# Patient Record
Sex: Female | Born: 1970 | Race: Black or African American | Hispanic: No | Marital: Single | Smoking: Former smoker
Health system: Southern US, Community
[De-identification: ages and names within clinical notes are randomized; demographics above are authoritative.]

## PROBLEM LIST (undated history)

## (undated) DIAGNOSIS — K219 Gastro-esophageal reflux disease without esophagitis: Secondary | ICD-10-CM

## (undated) DIAGNOSIS — J42 Unspecified chronic bronchitis: Secondary | ICD-10-CM

## (undated) DIAGNOSIS — J4530 Mild persistent asthma, uncomplicated: Secondary | ICD-10-CM

## (undated) DIAGNOSIS — L509 Urticaria, unspecified: Secondary | ICD-10-CM

## (undated) DIAGNOSIS — L309 Dermatitis, unspecified: Secondary | ICD-10-CM

## (undated) HISTORY — DX: Dermatitis, unspecified: L30.9

## (undated) HISTORY — DX: Mild persistent asthma, uncomplicated: J45.30

## (undated) HISTORY — DX: Urticaria, unspecified: L50.9

## (undated) HISTORY — PX: TYMPANOSTOMY TUBE PLACEMENT: SHX32

## (undated) HISTORY — DX: Gastro-esophageal reflux disease without esophagitis: K21.9

## (undated) HISTORY — DX: Unspecified chronic bronchitis: J42

---

## 2004-08-05 ENCOUNTER — Encounter: Admission: RE | Admit: 2004-08-05 | Discharge: 2004-08-05 | Payer: Self-pay | Admitting: Gastroenterology

## 2004-09-04 ENCOUNTER — Encounter: Admission: RE | Admit: 2004-09-04 | Discharge: 2004-09-04 | Payer: Self-pay | Admitting: Gastroenterology

## 2010-01-19 ENCOUNTER — Emergency Department (HOSPITAL_COMMUNITY): Admission: EM | Admit: 2010-01-19 | Discharge: 2010-01-19 | Payer: Self-pay | Admitting: Family Medicine

## 2015-03-20 ENCOUNTER — Ambulatory Visit (INDEPENDENT_AMBULATORY_CARE_PROVIDER_SITE_OTHER): Payer: PRIVATE HEALTH INSURANCE | Admitting: Internal Medicine

## 2015-03-20 ENCOUNTER — Encounter: Payer: Self-pay | Admitting: Internal Medicine

## 2015-03-20 VITALS — BP 120/88 | HR 81 | Temp 98.3°F | Resp 16 | Ht 62.0 in | Wt 156.0 lb

## 2015-03-20 DIAGNOSIS — L309 Dermatitis, unspecified: Secondary | ICD-10-CM | POA: Diagnosis not present

## 2015-03-20 DIAGNOSIS — E559 Vitamin D deficiency, unspecified: Secondary | ICD-10-CM

## 2015-03-20 DIAGNOSIS — M25511 Pain in right shoulder: Secondary | ICD-10-CM | POA: Insufficient documentation

## 2015-03-20 DIAGNOSIS — J302 Other seasonal allergic rhinitis: Secondary | ICD-10-CM | POA: Diagnosis not present

## 2015-03-20 DIAGNOSIS — M25512 Pain in left shoulder: Secondary | ICD-10-CM

## 2015-03-20 DIAGNOSIS — J3089 Other allergic rhinitis: Secondary | ICD-10-CM | POA: Insufficient documentation

## 2015-03-20 MED ORDER — TRIAMCINOLONE ACETONIDE 0.1 % EX CREA: 1.0000 "application " | TOPICAL_CREAM | Freq: Two times a day (BID) | CUTANEOUS | Status: DC

## 2015-03-20 NOTE — Assessment & Plan Note (Signed)
?   Contact dermatitis We will try a topical steroid cream twice daily for up to 2 weeks. Advised her not to take for one weeks Can use intermittently If no improvement she will need a dermatologist

## 2015-03-20 NOTE — Progress Notes (Signed)
Pre visit review using our clinic review tool, if applicable. No additional management support is needed unless otherwise documented below in the visit note. 

## 2015-03-20 NOTE — Progress Notes (Signed)
Subjective:    Patient ID: Kathleen Ortiz, female    DOB: [birth year], 44 y.o.   MRN: [id number]  HPI She is here to establish with a new pcp.   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  She has had bilateral shoulder/arm pain for a couple of months- they ache.  It has improved, but they are still slightly painful. Picking her arms up a certain way or putting her bra on/off would increase the pain.  Laying on either shoulder would be painful.  She denies any other new activities that could have caused it.  She did not take any otc meds, they just improved on their own.    Itch on right hand.  She used to work with chemicals, but has not done that for months. She developed an itchy rash and thinks it may be from the chemicals. She has a rash on her right hand that is.  If she itches it, it will itch all day.  She has not tried putting anything on it.  Smokes two cigarettes daily.  One with coffee and one before bed.  She has not tried quitting.    She is not exercising regularly.  Medications and allergies reviewed with patient and updated if appropriate.  Patient Active Problem List   Diagnosis Date Noted  . Allergic rhinitis 03/20/2015  . Vitamin D deficiency 03/20/2015    No current outpatient prescriptions on file prior to visit.   No current facility-administered medications on file prior to visit.    Past Medical History  Diagnosis Date  . GERD (gastroesophageal reflux disease)   . Chronic bronchitis (HCC)     History reviewed. No pertinent past surgical history.  Social History   Social History  . Marital Status: Single    Spouse Name: N/A  . Number of Children: N/A  . Years of Education: N/A   Social History Main Topics  . Smoking status: Current Every Day Smoker  . Smokeless tobacco: Never Used  . Alcohol Use: Yes  . Drug Use: No  . Sexual Activity: Not Asked   Other  Topics Concern  . None   Social History Narrative   No regular exercise          Review of Systems  Constitutional: Negative for fever and chills.  Respiratory: Negative for cough, shortness of breath and wheezing.   Cardiovascular: Negative for chest pain, palpitations and leg swelling.  Neurological: Positive for headaches (with periods). Negative for dizziness, weakness, light-headedness and numbness.  Psychiatric/Behavioral:       Worries about her son - has schizophrenia       Objective:   Filed Vitals:   03/20/15 1520  BP: 120/88  Pulse: 81  Temp: 98.3 F (36.8 C)  Resp: 16   Filed Weights   03/20/15 1520  Weight: 156 lb (70.761 kg)   Body mass index is 28.53 kg/(m^2).   Physical Exam  Constitutional: She appears well-developed and well-nourished. No distress.  HENT:  Head: Normocephalic and atraumatic.  Neck: Neck supple. No tracheal deviation present. No thyromegaly present.  Cardiovascular: Normal rate, regular rhythm and normal heart sounds.   No murmur heard. Pulmonary/Chest: Effort normal and breath sounds normal. No respiratory distress. She has no wheezes. She has no rales.  Abdominal: Soft. She exhibits no distension. There is no tenderness.  Musculoskeletal: She exhibits no edema.  B/l shoulder with  FROM, slight tenderness on anterior aspect of shoulder, no swelling/deformity  Lymphadenopathy:    She has no cervical adenopathy.  Neurological:  No numbness/tingling or weakness in upper extremities  Skin: Skin is warm and dry. Rash (slight hyperpigmentation on right posterior hand - circular, sliightly raised) noted. She is not diaphoretic.  Psychiatric: She has a normal mood and affect. Her behavior is normal.       Assessment & Plan:   See Problem List for A/P  Follow up as needed

## 2015-03-20 NOTE — Patient Instructions (Signed)
Call if your shoulder pain does not continue to improve or worsens.  I will have you see a sports medicine doctor.   Use the steroid cream on the rash on your right hand as prescribed.  If the rash does not go away consider seeing your dermatologist.   Follow up as needed

## 2015-03-20 NOTE — Assessment & Plan Note (Signed)
Controlled Continue medication as needed

## 2015-03-20 NOTE — Assessment & Plan Note (Signed)
Improving without intervention If it does not continue to improve or if her shoulder pain worsens she will let me know and I will refer to Dr. Katrinka BlazingSmith for further evaluation

## 2015-07-29 ENCOUNTER — Encounter: Payer: Self-pay | Admitting: Internal Medicine

## 2015-07-29 ENCOUNTER — Ambulatory Visit (INDEPENDENT_AMBULATORY_CARE_PROVIDER_SITE_OTHER): Payer: PRIVATE HEALTH INSURANCE | Admitting: Internal Medicine

## 2015-07-29 ENCOUNTER — Ambulatory Visit (INDEPENDENT_AMBULATORY_CARE_PROVIDER_SITE_OTHER)
Admission: RE | Admit: 2015-07-29 | Discharge: 2015-07-29 | Disposition: A | Discharge status: Home / Self Care | Payer: PRIVATE HEALTH INSURANCE | Source: Ambulatory Visit | Attending: Internal Medicine | Admitting: Internal Medicine

## 2015-07-29 VITALS — BP 118/86 | HR 90 | Temp 99.2°F | Resp 16 | Ht 63.0 in | Wt 154.0 lb

## 2015-07-29 DIAGNOSIS — M7581 Other shoulder lesions, right shoulder: Secondary | ICD-10-CM

## 2015-07-29 MED ORDER — NAPROXEN 500 MG PO TABS: 500.0000 mg | ORAL_TABLET | Freq: Two times a day (BID) | ORAL | Status: DC

## 2015-07-29 NOTE — Patient Instructions (Addendum)
Have your xray today and we will call you with the results.  Take the naprosyn twice daily with food - do not take the dinner dose if you take the aleve pm at night.  Monitor for stomach symptoms (heartburn, stomach pain or upset) and if you have this stop the medication.       Rotator Cuff Tendinitis Rotator cuff tendinitis is inflammation of the tough, cord-like bands that connect muscle to bone (tendons) in your rotator cuff. Your rotator cuff is the collection of all the muscles and tendons that connect your arm to your shoulder. Your rotator cuff holds the head of your upper arm bone (humerus) in the cup (fossa) of your shoulder blade (scapula). CAUSES Rotator cuff tendinitis is usually caused by overusing the joint involved.  SIGNS AND SYMPTOMS  Deep ache in the shoulder also felt on the outside upper arm over the shoulder muscle.  Point tenderness over the area that is injured.  Pain comes on gradually and becomes worse with lifting the arm to the side (abduction) or turning it inward (internal rotation).  May lead to a chronic tear: When a rotator cuff tendon becomes inflamed, it runs the risk of losing its blood supply, causing some tendon fibers to die. This increases the risk that the tendon can fray and partially or completely tear. DIAGNOSIS Rotator cuff tendinitis is diagnosed by taking a medical history, performing a physical exam, and reviewing results of imaging exams. The medical history is useful to help determine the type of rotator cuff injury. The physical exam will include looking at the injured shoulder, feeling the injured area, and watching you do range-of-motion exercises. X-ray exams are typically done to rule out other causes of shoulder pain, such as fractures. MRI is the imaging exam usually used for significant shoulder injuries. Sometimes a dye study called CT arthrogram is done, but it is not as widely used as MRI. In some institutions, special ultrasound tests  may also be used to aid in the diagnosis. TREATMENT  Less Severe Cases  Use of a sling to rest the shoulder for a short period of time. Prolonged use of the sling can cause stiffness, weakness, and loss of motion of the shoulder joint.  Anti-inflammatory medicines, such as ibuprofen or naproxen sodium, may be prescribed. More Severe Cases  Physical therapy.  Use of steroid injections into the shoulder joint.  Surgery. HOME CARE INSTRUCTIONS   Use a sling or splint until the pain decreases. Prolonged use of the sling can cause stiffness, weakness, and loss of motion of the shoulder joint.  Apply ice to the injured area:  Put ice in a plastic bag.  Place a towel between your skin and the bag.  Leave the ice on for 20 minutes, 2-3 times a day.  Try to avoid use other than gentle range of motion while your shoulder is painful. Use the shoulder and exercise only as directed by your health care provider. Stop exercises or range of motion if pain or discomfort increases, unless directed otherwise by your health care provider.  Only take over-the-counter or prescription medicines for pain, discomfort, or fever as directed by your health care provider.  If you were given a shoulder sling and straps (immobilizer), do not remove it except as directed, or until you see a health care provider for a follow-up exam. If you need to remove it, move your arm as little as possible or as directed.  You may want to sleep on several pillows  at night to lessen swelling and pain. SEEK IMMEDIATE MEDICAL CARE IF:   Your shoulder pain increases or new pain develops in your arm, hand, or fingers and is not relieved with medicines.  You have new, unexplained symptoms, especially increased numbness in the hands or loss of strength.  You develop any worsening of the problems that brought you in for care.  Your arm, hand, or fingers are numb or tingling.  Your arm, hand, or fingers are swollen, painful,  or turn white or blue. MAKE SURE YOU:  Understand these instructions.  Will watch your condition.  Will get help right away if you are not doing well or get worse.   This information is not intended to replace advice given to you by your health care provider. Make sure you discuss any questions you have with your health care provider.   Document Released: 06/13/2003 Document Revised: 04/13/2014 Document Reviewed: 11/02/2012 Elsevier Interactive Patient Education [employer]

## 2015-07-29 NOTE — Progress Notes (Signed)
Pre visit review using our clinic review tool, if applicable. No additional management support is needed unless otherwise documented below in the visit note. 

## 2015-07-29 NOTE — Progress Notes (Signed)
Subjective:    Patient ID: Kathleen Ortiz, female    DOB: 1970-11-03, 45 y.o.   MRN: [id number]  HPI She is here for an acute visit for shoulder pain.     Her symptoms started in the fall of last year.  She was having b/l shoulder pain and related it to her job.  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  Certain movements with her arms and putting her bra on and off increases her pain.  Laying on her shoulders is painful at night.  When I last saw her in December the pain in her shoulders had improved without treatment.   It never went away, but has gotten tolerable.  Some days she does not have any pain, but a few days ago she had severe pain in the right shoulder.  Certain movements increase her pain.  Over the counter meds have not helped, including aleve, which she typically takes one aleve pm at night.  She tried tramadol and it did not help.  The pain radiates down her right arm.  She denies numbness, tingling in the arm.    She denies weakness in the hands.  The pain in her left shoulder is minimal and intermittent.     Medications and allergies reviewed with patient and updated if appropriate.  Patient Active Problem List   Diagnosis Date Noted  . Allergic rhinitis 03/20/2015  . Vitamin D deficiency 03/20/2015  . Pain of both shoulder joints 03/20/2015  . Dermatitis 03/20/2015    Current Outpatient Prescriptions on File Prior to Visit  Medication Sig Dispense Refill  . Aspirin-Salicylamide-Caffeine (BC HEADACHE PO) Take by mouth daily as needed.    . clindamycin (CLEOCIN T) 1 % lotion Apply topically 2 (two) times daily.    Marland Kitchen Fexofenadine HCl (ALLEGRA PO) Take by mouth daily.    . Fluticasone Propionate (FLONASE NA) Place into the nose daily as needed.    . Pseudoephedrine-APAP-DM (DAYQUIL PO) Take by mouth as needed.    . triamcinolone cream (KENALOG) 0.1 % Apply 1 application topically 2 (two) times daily. Do not use for more than two  weeks, can use intermittently as needed 30 g 0  . Vitamin D, Cholecalciferol, 1000 UNITS CAPS Take 2,000 Units by mouth 3 (three) times a week.     No current facility-administered medications on file prior to visit.    Past Medical History  Diagnosis Date  . GERD (gastroesophageal reflux disease)   . Chronic bronchitis (HCC)     No past surgical history on file.  Social History   Social History  . Marital Status: Single    Spouse Name: N/A  . Number of Children: N/A  . Years of Education: N/A   Social History Main Topics  . Smoking status: Current Every Day Smoker  . Smokeless tobacco: Never Used  . Alcohol Use: Yes  . Drug Use: No  . Sexual Activity: Not Asked   Other Topics Concern  . None   Social History Narrative   No regular exercise          Family History  Problem Relation Age of Onset  . Arthritis Mother   . Cancer Mother     Lung & colon  . Hypertension Mother   . Diabetes Maternal Aunt   . Arthritis Maternal Grandmother   . Hypertension Maternal Grandmother   . Heart disease Maternal Grandmother   . Diabetes Maternal Grandmother   .  Cancer Paternal Grandfather     Review of Systems See HPI    Objective:   Filed Vitals:   07/29/15 0944  BP: 118/86  Pulse: 90  Temp: 99.2 F (37.3 C)  Resp: 16   Filed Weights   07/29/15 0944  Weight: 154 lb (69.854 kg)   Body mass index is 27.29 kg/(m^2).   Physical Exam A Right Shoulder exam was performed.   SWELLING: none  EFFUSION: no  WARMTH: no warmth  TENDERNESS: tenderness anterior aspect of shoulder, mild tenderness on lateral and posterior aspect of shoulder ROM: full ROM with pain, no crepitus NEUROLOGICAL EXAM: normal sensation and strength  PULSES: normal       Assessment & Plan:   Right shoulder pain Possible rotator tendinitis Will check an xray, but I expect it to be normal Start naprosyn 500 mg twice daily with food  -  She will not take the dinner dose if she want to  take aleve pm --  Stop if she experiences stomach symptoms Will see Tammy SoursGreg for possible steroid injection

## 2015-08-02 ENCOUNTER — Ambulatory Visit (INDEPENDENT_AMBULATORY_CARE_PROVIDER_SITE_OTHER): Payer: PRIVATE HEALTH INSURANCE | Admitting: Family

## 2015-08-02 ENCOUNTER — Encounter: Payer: Self-pay | Admitting: Family

## 2015-08-02 VITALS — BP 118/86 | HR 76 | Temp 98.1°F | Resp 16 | Ht 63.0 in | Wt 154.0 lb

## 2015-08-02 DIAGNOSIS — M25511 Pain in right shoulder: Secondary | ICD-10-CM | POA: Insufficient documentation

## 2015-08-02 MED ORDER — DICLOFENAC SODIUM 2 % TD SOLN: 1.0000 "application " | Freq: Two times a day (BID) | TRANSDERMAL | Status: DC | PRN

## 2015-08-02 MED ORDER — NAPROXEN-ESOMEPRAZOLE 500-20 MG PO TBEC: 1.0000 | DELAYED_RELEASE_TABLET | Freq: Two times a day (BID) | ORAL | Status: DC | PRN

## 2015-08-02 NOTE — Patient Instructions (Addendum)
Thank you for choosing [facility].  Summary/Instructions:  Ice 2-3 times per day and after [employer] daily. Pennsaid - 2x per day as needed about 1/2 pack per dose Vimovo - 2x per day as needed.   Your prescription(s) have been submitted to your pharmacy or been printed and provided for you. Please take as directed and contact our office if you believe you are having problem(s) with the medication(s) or have any questions. If your symptoms worsen or fail to improve, please contact our office for further instruction, or in case of emergency go directly to the emergency room at the closest medical facility.    Impingement Syndrome, Rotator Cuff, Bursitis With Rehab Impingement syndrome is a condition that involves inflammation of the tendons of the rotator cuff and the subacromial bursa, that causes pain in the shoulder. The rotator cuff consists of four tendons and muscles that control much of the shoulder and upper arm function. The subacromial bursa is a fluid filled sac that helps reduce friction between the rotator cuff and one of the bones of the shoulder (acromion). Impingement syndrome is usually an overuse injury that causes swelling of the bursa (bursitis), swelling of the tendon (tendonitis), and/or a tear of the tendon (strain). Strains are classified into three categories. Grade 1 strains cause pain, but the tendon is not lengthened. Grade 2 strains include a lengthened ligament, due to the ligament being stretched or partially ruptured. With grade 2 strains there is still function, although the function may be decreased. Grade 3 strains include a complete tear of the tendon or muscle, and function is usually impaired. SYMPTOMS   Pain around the shoulder, often at the outer portion of the upper arm.  Pain that gets worse with shoulder function, especially when reaching overhead or lifting.  Sometimes, aching when not using the arm.  Pain that wakes you up at  night.  Sometimes, tenderness, swelling, warmth, or redness over the affected area.  Loss of strength.  Limited motion of the shoulder, especially reaching behind the back (to the back pocket or to unhook bra) or across your body.  Crackling sound (crepitation) when moving the arm.  Biceps tendon pain and inflammation (in the front of the shoulder). Worse when bending the elbow or lifting. CAUSES  Impingement syndrome is often an overuse injury, in which chronic (repetitive) motions cause the tendons or bursa to become inflamed. A strain occurs when a force is paced on the tendon or muscle that is greater than it can withstand. Common mechanisms of injury include: Stress from sudden increase in duration, frequency, or intensity of training.  Direct hit (trauma) to the shoulder.  Aging, erosion of the tendon with normal use.  Bony bump on shoulder (acromial spur). RISK INCREASES WITH:  Contact sports (football, wrestling, boxing).  Throwing sports (baseball, tennis, volleyball).  Weightlifting and bodybuilding.  Heavy labor.  Previous injury to the rotator cuff, including impingement.  Poor shoulder strength and flexibility.  Failure to warm up properly before activity.  Inadequate protective equipment.  Old age.  Bony bump on shoulder (acromial spur). PREVENTION   Warm up and stretch properly before activity.  Allow for adequate recovery between workouts.  Maintain physical fitness:  Strength, flexibility, and endurance.  Cardiovascular fitness.  Learn and use proper exercise technique. PROGNOSIS  If treated properly, impingement syndrome usually goes away within 6 weeks. Sometimes surgery is required.  RELATED COMPLICATIONS   Longer healing time if not properly treated, or if not given enough time  to heal.  Recurring symptoms, that result in a chronic condition.  Shoulder stiffness, frozen shoulder, or loss of motion.  Rotator cuff tendon  tear.  Recurring symptoms, especially if activity is resumed too soon, with overuse, with a direct blow, or when using poor technique. TREATMENT  Treatment first involves the use of ice and medicine, to reduce pain and inflammation. The use of strengthening and stretching exercises may help reduce pain with activity. These exercises may be performed at home or with a therapist. If non-surgical treatment is unsuccessful after more than 6 months, surgery may be advised. After surgery and rehabilitation, activity is usually possible in 3 months.  MEDICATION  If pain medicine is needed, nonsteroidal anti-inflammatory medicines (aspirin and ibuprofen), or other minor pain relievers (acetaminophen), are often advised.  Do not take pain medicine for 7 days before surgery.  Prescription pain relievers may be given, if your caregiver thinks they are needed. Use only as directed and only as much as you need.  Corticosteroid injections may be given by your caregiver. These injections should be reserved for the most serious cases, because they may only be given a certain number of times. HEAT AND COLD  Cold treatment (icing) should be applied for 10 to 15 minutes every 2 to 3 hours for inflammation and pain, and immediately after activity that aggravates your symptoms. Use ice packs or an ice massage.  Heat treatment may be used before performing stretching and strengthening activities prescribed by your caregiver, physical therapist, or athletic trainer. Use a heat pack or a warm water soak. SEEK MEDICAL CARE IF:   Symptoms get worse or do not improve in 4 to 6 weeks, despite treatment.  New, unexplained symptoms develop. (Drugs used in treatment may produce side effects.) EXERCISES  RANGE OF MOTION (ROM) AND STRETCHING EXERCISES - Impingement Syndrome (Rotator Cuff  Tendinitis, Bursitis) These exercises may help you when beginning to rehabilitate your injury. Your symptoms may go away with or  without further involvement from your physician, physical therapist or athletic trainer. While completing these exercises, remember:   Restoring tissue flexibility helps normal motion to return to the joints. This allows healthier, less painful movement and activity.  An effective stretch should be held for at least 30 seconds.  A stretch should never be painful. You should only feel a gentle lengthening or release in the stretched tissue. STRETCH - Flexion, Standing  Stand with good posture. With an underhand grip on your right / left hand, and an overhand grip on the opposite hand, grasp a broomstick or cane so that your hands are a little more than shoulder width apart.  Keeping your right / left elbow straight and shoulder muscles relaxed, push the stick with your opposite hand, to raise your right / left arm in front of your body and then overhead. Raise your arm until you feel a stretch in your right / left shoulder, but before you have increased shoulder pain.  Try to avoid shrugging your right / left shoulder as your arm rises, by keeping your shoulder blade tucked down and toward your mid-back spine. Hold for __________ seconds.  Slowly return to the starting position. Repeat __________ times. Complete this exercise __________ times per day. STRETCH - Abduction, Supine  Lie on your back. With an underhand grip on your right / left hand and an overhand grip on the opposite hand, grasp a broomstick or cane so that your hands are a little more than shoulder width apart.  Keeping  your right / left elbow straight and your shoulder muscles relaxed, push the stick with your opposite hand, to raise your right / left arm out to the side of your body and then overhead. Raise your arm until you feel a stretch in your right / left shoulder, but before you have increased shoulder pain.  Try to avoid shrugging your right / left shoulder as your arm rises, by keeping your shoulder blade tucked down  and toward your mid-back spine. Hold for __________ seconds.  Slowly return to the starting position. Repeat __________ times. Complete this exercise __________ times per day. ROM - Flexion, Active-Assisted  Lie on your back. You may bend your knees for comfort.  Grasp a broomstick or cane so your hands are about shoulder width apart. Your right / left hand should grip the end of the stick, so that your hand is positioned "thumbs-up," as if you were about to shake hands.  Using your healthy arm to lead, raise your right / left arm overhead, until you feel a gentle stretch in your shoulder. Hold for __________ seconds.  Use the stick to assist in returning your right / left arm to its starting position. Repeat __________ times. Complete this exercise __________ times per day.  ROM - Internal Rotation, Supine   Lie on your back on a firm surface. Place your right / left elbow about 60 degrees away from your side. Elevate your elbow with a folded towel, so that the elbow and shoulder are the same height.  Using a broomstick or cane and your strong arm, pull your right / left hand toward your body until you feel a gentle stretch, but no increase in your shoulder pain. Keep your shoulder and elbow in place throughout the exercise.  Hold for __________ seconds. Slowly return to the starting position. Repeat __________ times. Complete this exercise __________ times per day. STRETCH - Internal Rotation  Place your right / left hand behind your back, palm up.  Throw a towel or belt over your opposite shoulder. Grasp the towel with your right / left hand.  While keeping an upright posture, gently pull up on the towel, until you feel a stretch in the front of your right / left shoulder.  Avoid shrugging your right / left shoulder as your arm rises, by keeping your shoulder blade tucked down and toward your mid-back spine.  Hold for __________ seconds. Release the stretch, by lowering your  healthy hand. Repeat __________ times. Complete this exercise __________ times per day. ROM - Internal Rotation   Using an underhand grip, grasp a stick behind your back with both hands.  While standing upright with good posture, slide the stick up your back until you feel a mild stretch in the front of your shoulder.  Hold for __________ seconds. Slowly return to your starting position. Repeat __________ times. Complete this exercise __________ times per day.  STRETCH - Posterior Shoulder Capsule   Stand or sit with good posture. Grasp your right / left elbow and draw it across your chest, keeping it at the same height as your shoulder.  Pull your elbow, so your upper arm comes in closer to your chest. Pull until you feel a gentle stretch in the back of your shoulder.  Hold for __________ seconds. Repeat __________ times. Complete this exercise __________ times per day. STRENGTHENING EXERCISES - Impingement Syndrome (Rotator Cuff Tendinitis, Bursitis) These exercises may help you when beginning to rehabilitate your injury. They may resolve your symptoms  with or without further involvement from your physician, physical therapist or athletic trainer. While completing these exercises, remember:  Muscles can gain both the endurance and the strength needed for everyday activities through controlled exercises.  Complete these exercises as instructed by your physician, physical therapist or athletic trainer. Increase the resistance and repetitions only as guided.  You may experience muscle soreness or fatigue, but the pain or discomfort you are trying to eliminate should never worsen during these exercises. If this pain does get worse, stop and make sure you are following the directions exactly. If the pain is still present after adjustments, discontinue the exercise until you can discuss the trouble with your clinician.  During your recovery, avoid activity or exercises which involve actions  that place your injured hand or elbow above your head or behind your back or head. These positions stress the tissues which you are trying to heal. STRENGTH - Scapular Depression and Adduction   With good posture, sit on a firm chair. Support your arms in front of you, with pillows, arm rests, or on a table top. Have your elbows in line with the sides of your body.  Gently draw your shoulder blades down and toward your mid-back spine. Gradually increase the tension, without tensing the muscles along the top of your shoulders and the back of your neck.  Hold for __________ seconds. Slowly release the tension and relax your muscles completely before starting the next repetition.  After you have practiced this exercise, remove the arm support and complete the exercise in standing as well as sitting position. Repeat __________ times. Complete this exercise __________ times per day.  STRENGTH - Shoulder Abductors, Isometric  With good posture, stand or sit about 4-6 inches from a wall, with your right / left side facing the wall.  Bend your right / left elbow. Gently press your right / left elbow into the wall. Increase the pressure gradually, until you are pressing as hard as you can, without shrugging your shoulder or increasing any shoulder discomfort.  Hold for __________ seconds.  Release the tension slowly. Relax your shoulder muscles completely before you begin the next repetition. Repeat __________ times. Complete this exercise __________ times per day.  STRENGTH - External Rotators, Isometric  Keep your right / left elbow at your side and bend it 90 degrees.  Step into a door frame so that the outside of your right / left wrist can press against the door frame without your upper arm leaving your side.  Gently press your right / left wrist into the door frame, as if you were trying to swing the back of your hand away from your stomach. Gradually increase the tension, until you are  pressing as hard as you can, without shrugging your shoulder or increasing any shoulder discomfort.  Hold for __________ seconds.  Release the tension slowly. Relax your shoulder muscles completely before you begin the next repetition. Repeat __________ times. Complete this exercise __________ times per day.  STRENGTH - Supraspinatus   Stand or sit with good posture. Grasp a __________ weight, or an exercise band or tubing, so that your hand is "thumbs-up," like you are shaking hands.  Slowly lift your right / left arm in a "V" away from your thigh, diagonally into the space between your side and straight ahead. Lift your hand to shoulder height or as far as you can, without increasing any shoulder pain. At first, many people do not lift their hands above shoulder height.  Avoid shrugging your right / left shoulder as your arm rises, by keeping your shoulder blade tucked down and toward your mid-back spine.  Hold for __________ seconds. Control the descent of your hand, as you slowly return to your starting position. Repeat __________ times. Complete this exercise __________ times per day.  STRENGTH - External Rotators  Secure a rubber exercise band or tubing to a fixed object (table, pole) so that it is at the same height as your right / left elbow when you are standing or sitting on a firm surface.  Stand or sit so that the secured exercise band is at your uninjured side.  Bend your right / left elbow 90 degrees. Place a folded towel or small pillow under your right / left arm, so that your elbow is a few inches away from your side.  Keeping the tension on the exercise band, pull it away from your body, as if pivoting on your elbow. Be sure to keep your body steady, so that the movement is coming only from your rotating shoulder.  Hold for __________ seconds. Release the tension in a controlled manner, as you return to the starting position. Repeat __________ times. Complete this  exercise __________ times per day.  STRENGTH - Internal Rotators   Secure a rubber exercise band or tubing to a fixed object (table, pole) so that it is at the same height as your right / left elbow when you are standing or sitting on a firm surface.  Stand or sit so that the secured exercise band is at your right / left side.  Bend your elbow 90 degrees. Place a folded towel or small pillow under your right / left arm so that your elbow is a few inches away from your side.  Keeping the tension on the exercise band, pull it across your body, toward your stomach. Be sure to keep your body steady, so that the movement is coming only from your rotating shoulder.  Hold for __________ seconds. Release the tension in a controlled manner, as you return to the starting position. Repeat __________ times. Complete this exercise __________ times per day.  STRENGTH - Scapular Protractors, Standing   Stand arms length away from a wall. Place your hands on the wall, keeping your elbows straight.  Begin by dropping your shoulder blades down and toward your mid-back spine.  To strengthen your protractors, keep your shoulder blades down, but slide them forward on your rib cage. It will feel as if you are lifting the back of your rib cage away from the wall. This is a subtle motion and can be challenging to complete. Ask your caregiver for further instruction, if you are not sure you are doing the exercise correctly.  Hold for __________ seconds. Slowly return to the starting position, resting the muscles completely before starting the next repetition. Repeat __________ times. Complete this exercise __________ times per day. STRENGTH - Scapular Protractors, Supine  Lie on your back on a firm surface. Extend your right / left arm straight into the air while holding a __________ weight in your hand.  Keeping your head and back in place, lift your shoulder off the floor.  Hold for __________ seconds. Slowly  return to the starting position, and allow your muscles to relax completely before starting the next repetition. Repeat __________ times. Complete this exercise __________ times per day. STRENGTH - Scapular Protractors, Quadruped  Get onto your hands and knees, with your shoulders directly over your hands (or as  close as you can be, comfortably).  Keeping your elbows locked, lift the back of your rib cage up into your shoulder blades, so your mid-back rounds out. Keep your neck muscles relaxed.  Hold this position for __________ seconds. Slowly return to the starting position and allow your muscles to relax completely before starting the next repetition. Repeat __________ times. Complete this exercise __________ times per day.  STRENGTH - Scapular Retractors  Secure a rubber exercise band or tubing to a fixed object (table, pole), so that it is at the height of your shoulders when you are either standing, or sitting on a firm armless chair.  With a palm down grip, grasp an end of the band in each hand. Straighten your elbows and lift your hands straight in front of you, at shoulder height. Step back, away from the secured end of the band, until it becomes tense.  Squeezing your shoulder blades together, draw your elbows back toward your sides, as you bend them. Keep your upper arms lifted away from your body throughout the exercise.  Hold for __________ seconds. Slowly ease the tension on the band, as you reverse the directions and return to the starting position. Repeat __________ times. Complete this exercise __________ times per day. STRENGTH - Shoulder Extensors   Secure a rubber exercise band or tubing to a fixed object (table, pole) so that it is at the height of your shoulders when you are either standing, or sitting on a firm armless chair.  With a thumbs-up grip, grasp an end of the band in each hand. Straighten your elbows and lift your hands straight in front of you, at shoulder  height. Step back, away from the secured end of the band, until it becomes tense.  Squeezing your shoulder blades together, pull your hands down to the sides of your thighs. Do not allow your hands to go behind you.  Hold for __________ seconds. Slowly ease the tension on the band, as you reverse the directions and return to the starting position. Repeat __________ times. Complete this exercise __________ times per day.  STRENGTH - Scapular Retractors and External Rotators   Secure a rubber exercise band or tubing to a fixed object (table, pole) so that it is at the height as your shoulders, when you are either standing, or sitting on a firm armless chair.  With a palm down grip, grasp an end of the band in each hand. Bend your elbows 90 degrees and lift your elbows to shoulder height, at your sides. Step back, away from the secured end of the band, until it becomes tense.  Squeezing your shoulder blades together, rotate your shoulders so that your upper arms and elbows remain stationary, but your fists travel upward to head height.  Hold for __________ seconds. Slowly ease the tension on the band, as you reverse the directions and return to the starting position. Repeat __________ times. Complete this exercise __________ times per day.  STRENGTH - Scapular Retractors and External Rotators, Rowing   Secure a rubber exercise band or tubing to a fixed object (table, pole) so that it is at the height of your shoulders, when you are either standing, or sitting on a firm armless chair.  With a palm down grip, grasp an end of the band in each hand. Straighten your elbows and lift your hands straight in front of you, at shoulder height. Step back, away from the secured end of the band, until it becomes tense.  Step 1: Squeeze  your shoulder blades together. Bending your elbows, draw your hands to your chest, as if you are rowing a boat. At the end of this motion, your hands and elbow should be at  shoulder height and your elbows should be out to your sides.  Step 2: Rotate your shoulders, to raise your hands above your head. Your forearms should be vertical and your upper arms should be horizontal.  Hold for __________ seconds. Slowly ease the tension on the band, as you reverse the directions and return to the starting position. Repeat __________ times. Complete this exercise __________ times per day.  STRENGTH - Scapular Depressors  Find a sturdy chair without wheels, such as a dining room chair.  Keeping your feet on the floor, and your hands on the chair arms, lift your bottom up from the seat, and lock your elbows.  Keeping your elbows straight, allow gravity to pull your body weight down. Your shoulders will rise toward your ears.  Raise your body against gravity by drawing your shoulder blades down your back, shortening the distance between your shoulders and ears. Although your feet should always maintain contact with the floor, your feet should progressively support less body weight, as you get stronger.  Hold for __________ seconds. In a controlled and slow manner, lower your body weight to begin the next repetition. Repeat __________ times. Complete this exercise __________ times per day.    This information is not intended to replace advice given to you by your health care provider. Make sure you discuss any questions you have with your health care provider.   Document Released: 03/23/2005 Document Revised: 04/13/2014 Document Reviewed: 07/05/2008 [employer] Interactive Patient Education [employer]

## 2015-08-02 NOTE — Progress Notes (Signed)
Pre visit review using our clinic review tool, if applicable. No additional management support is needed unless otherwise documented below in the visit note. 

## 2015-08-02 NOTE — Progress Notes (Signed)
Subjective:    Patient ID: Kathleen Ortiz, female    DOB: 04/16/1970, 45 y.o.   MRN: [id number]  Chief Complaint  Patient presents with  . Shoulder Pain    Possible right shoulder injection    HPI:  Kathleen Ortiz is a 45 y.o. female who  has a past medical history of GERD (gastroesophageal reflux disease) and Chronic bronchitis (HCC). and presents today for an office visit.  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 She is right hand dominant. Aggravated by activities of daily living including putting her bra on and off. She was started on naproxen. Previous x-rays were reviewed and negative for acute pathology or structural changes. Pain generally waxes and wanes and described as sharp. Located around her shoulder with no specific point of pain. Reports the pain is 70-80% improved since starting the naproxen. Denies neck pain, or numbness/tingling of bilateral upper extremities.   Allergies  Allergen Reactions  . Latex Rash     Current Outpatient Prescriptions on File Prior to Visit  Medication Sig Dispense Refill  . Aspirin-Salicylamide-Caffeine (BC HEADACHE PO) Take by mouth daily as needed.    . clindamycin (CLEOCIN T) 1 % lotion Apply topically 2 (two) times daily.    Marland Kitchen. Fexofenadine HCl (ALLEGRA PO) Take by mouth daily.    . Fluticasone Propionate (FLONASE NA) Place into the nose daily as needed.    . Pseudoephedrine-APAP-DM (DAYQUIL PO) Take by mouth as needed.    . triamcinolone cream (KENALOG) 0.1 % Apply 1 application topically 2 (two) times daily. Do not use for more than two weeks, can use intermittently as needed 30 g 0  . Vitamin D, Cholecalciferol, 1000 UNITS CAPS Take 2,000 Units by mouth 3 (three) times a week.     No current facility-administered medications on file prior to visit.    Past Medical History  Diagnosis Date  . GERD (gastroesophageal reflux  disease)   . Chronic bronchitis (HCC)     Review of Systems  Constitutional: Negative for fever and chills.  Musculoskeletal:       Positive for right shoulder pain.  Neurological: Negative for dizziness and weakness.      Objective:    BP 118/86 mmHg  Pulse 76  Temp(Src) 98.1 F (36.7 C) (Oral)  Resp 16  Ht 5\' 3"  (1.6 m)  Wt 154 lb (69.854 kg)  BMI 27.29 kg/m2  SpO2 98%  LMP 07/11/2015 Nursing note and vital signs reviewed.  Physical Exam  Constitutional: She is oriented to person, place, and time. She appears well-developed and well-nourished. No distress.  Cardiovascular: Normal rate, regular rhythm, normal heart sounds and intact distal pulses.   Pulmonary/Chest: Effort normal and breath sounds normal.  Musculoskeletal:  Right shoulder - no obvious deformity, discoloration, or edema. Tenderness elicited along biceps tendon and subacromial space. No crepitus, masses, or defects noted. Range of motion is within normal limits bilaterally with discomfort noted greater than 120 of abduction. Strength is 4-5+. Distal pulses and sensation are intact and appropriate. Negative empty can; negative Leanord AsalHawkins Kennedy; negative apprehension; Neer's impingement with discomfort.  Neurological: She is alert and oriented to person, place, and time.  Skin: Skin is warm and dry.  Psychiatric: She has a normal mood and affect. Her behavior is normal. Judgment and thought content normal.   Examination: Ultrasound of shoulder Date:  08/02/2015 Patient Name: Kathleen Ortiz History: Right shoulder pain from repetitive motion  Findings:  No evidence of joint effusion. The biceps brachii long head tendon is normal without tendinosis, tear, tenosynovitis, or subluxation/dislocation. The supraspinatus, infraspinatus, subscapularis, and teres minor tendons are all normal. No subacromial-subdeltoid bursal abnormality and no sonographic evidence for subacromial impingement with dynamic maneuvers. The  posterior labrum was unremarkable.   Impression:  Unremarkable ultrasound examination of the shoulder.         All images are located under media tab. [place] ordered, performed and interpreted by Marcos Eke, FNP.     Assessment & Plan:   Problem List Items Addressed This Visit      Other   Right shoulder pain - Primary    Right shoulder pain most likely from rotator cuff and biceps tendinitis. Improved with conservative treatment to this point. Change naproxen to Vimovo. Start Pennsaid as needed. Initiate home exercise therapy and ice/heat with ice after activity. Follow up in 3 weeks or sooner if symptoms do not improve.       Relevant Medications   Diclofenac Sodium (PENNSAID) 2 % SOLN   Naproxen-Esomeprazole 500-20 MG TBEC   Other Relevant Orders   [place] Extrem Up [employer]       I have discontinued Kathleen Ortiz's naproxen. I am also having her start on Diclofenac Sodium and Naproxen-Esomeprazole. Additionally, I am having her maintain her Fluticasone Propionate (FLONASE NA), Fexofenadine HCl (ALLEGRA PO), Aspirin-Salicylamide-Caffeine (BC HEADACHE PO), Pseudoephedrine-APAP-DM (DAYQUIL PO), Vitamin D (Cholecalciferol), clindamycin, and triamcinolone cream.   Meds ordered this encounter  Medications  . Diclofenac Sodium (PENNSAID) 2 % SOLN    Sig: Place 1 application onto the skin 2 (two) times daily as needed.    Dispense:  112 g    Refill:  1    Order Specific Question:  Supervising Provider    Answer:  Hillard Danker A [[id number]]  . Naproxen-Esomeprazole 500-20 MG TBEC    Sig: Take 1 tablet by mouth 2 (two) times daily as needed.    Dispense:  60 tablet    Refill:  0    Order Specific Question:  Supervising Provider    Answer:  Hillard Danker A [[id number]]     Follow-up: Return in about 3 weeks (around 08/23/2015).  Jeanine Luz, FNP

## 2015-08-02 NOTE — Assessment & Plan Note (Signed)
Right shoulder pain most likely from rotator cuff and biceps tendinitis. Improved with conservative treatment to this point. Change naproxen to Vimovo. Start Pennsaid as needed. Initiate home exercise therapy and ice/heat with ice after activity. Follow up in 3 weeks or sooner if symptoms do not improve.

## 2015-08-09 ENCOUNTER — Ambulatory Visit: Payer: PRIVATE HEALTH INSURANCE | Admitting: Family

## 2015-08-26 ENCOUNTER — Other Ambulatory Visit: Payer: Self-pay | Admitting: Family

## 2015-08-27 ENCOUNTER — Other Ambulatory Visit: Payer: Self-pay | Admitting: Family

## 2015-11-25 LAB — HM COLONOSCOPY

## 2015-12-13 ENCOUNTER — Encounter: Payer: Self-pay | Admitting: Internal Medicine

## 2015-12-24 ENCOUNTER — Encounter: Payer: Self-pay | Admitting: Internal Medicine

## 2016-03-17 ENCOUNTER — Ambulatory Visit (INDEPENDENT_AMBULATORY_CARE_PROVIDER_SITE_OTHER): Payer: PRIVATE HEALTH INSURANCE | Admitting: Internal Medicine

## 2016-03-17 ENCOUNTER — Encounter: Payer: Self-pay | Admitting: Internal Medicine

## 2016-03-17 VITALS — BP 132/84 | HR 75 | Temp 98.7°F | Resp 16 | Wt 152.0 lb

## 2016-03-17 DIAGNOSIS — R21 Rash and other nonspecific skin eruption: Secondary | ICD-10-CM | POA: Diagnosis not present

## 2016-03-17 DIAGNOSIS — L299 Pruritus, unspecified: Secondary | ICD-10-CM | POA: Insufficient documentation

## 2016-03-17 NOTE — Patient Instructions (Signed)
Taking zyrtec daily at nighttime for the itching.   A referral was ordered for an allergist.

## 2016-03-17 NOTE — Progress Notes (Signed)
Pre visit review using our clinic review tool, if applicable. No additional management support is needed unless otherwise documented below in the visit note. 

## 2016-03-17 NOTE — Progress Notes (Signed)
Subjective:    Patient ID: Kathleen Ortiz, female    DOB: 01/26/1971, 45 y.o.   MRN: [id number]  HPI She is here for an acute visit.   Her skin has become more sensitive.   In the fall she startes breaking out, wihc is nt new, but this is worse.    With perfume ( causes bumps in her skin) or fabric softeners ( causes itches all over) - she itches all over.  She stopped using them in the past 2-3 weeks.  When she itches she she gets bmps and will break the skin.    Her legs are less affected, but she does have some leg itching.  She has facial itching, but not has bad as her body.  Yesterday her scalp was itching, but not as bad as her body.    Medications and allergies reviewed with patient and updated if appropriate.  Patient Active Problem List   Diagnosis Date Noted  . Right shoulder pain 08/02/2015  . Allergic rhinitis 03/20/2015  . Vitamin D deficiency 03/20/2015  . Pain of both shoulder joints 03/20/2015  . Dermatitis 03/20/2015    Current Outpatient Prescriptions on File Prior to Visit  Medication Sig Dispense Refill  . Aspirin-Salicylamide-Caffeine (BC HEADACHE PO) Take by mouth daily as needed.    . clindamycin (CLEOCIN T) 1 % lotion Apply topically 2 (two) times daily.    Marland Kitchen. Fexofenadine HCl (ALLEGRA PO) Take by mouth daily.    . Fluticasone Propionate (FLONASE NA) Place into the nose daily as needed.    Marland Kitchen. PENNSAID 2 % SOLN Place 1 application onto the skin 2 times daily as needed. 112 g 0  . Pseudoephedrine-APAP-DM (DAYQUIL PO) Take by mouth as needed.    . triamcinolone cream (KENALOG) 0.1 % Apply 1 application topically 2 (two) times daily. Do not use for more than two weeks, can use intermittently as needed 30 g 0  . VIMOVO 500-20 MG TBEC Take 1 tablet by mouth 2 (two) times daily as needed. 60 tablet 1  . Vitamin D, Cholecalciferol, 1000 UNITS CAPS Take 2,000 Units by mouth 3 (three) times a week.     No current facility-administered medications on file  prior to visit.     Past Medical History:  Diagnosis Date  . Chronic bronchitis (HCC)   . GERD (gastroesophageal reflux disease)     No past surgical history on file.  Social History   Social History  . Marital status: Single    Spouse name: N/A  . Number of children: N/A  . Years of education: N/A   Social History Main Topics  . Smoking status: Current Every Day Smoker  . Smokeless tobacco: Never Used  . Alcohol use Yes  . Drug use: No  . Sexual activity: Not on file   Other Topics Concern  . Not on file   Social History Narrative   No regular exercise          Family History  Problem Relation Age of Onset  . Arthritis Mother   . Cancer Mother     Lung & colon  . Hypertension Mother   . Diabetes Maternal Aunt   . Arthritis Maternal Grandmother   . Hypertension Maternal Grandmother   . Heart disease Maternal Grandmother   . Diabetes Maternal Grandmother   . Cancer Paternal Grandfather     Review of Systems  Constitutional: Negative for chills and fever.  HENT: Negative for sore throat and voice change.  Respiratory: Positive for wheezing (at night). Negative for cough and shortness of breath.   Gastrointestinal: Positive for abdominal pain (hiatal hernia related). Negative for blood in stool, constipation and diarrhea.  Skin: Positive for rash (chest).  Neurological: Positive for headaches (occ). Negative for light-headedness.       Objective:   Vitals:   03/17/16 1434  BP: 132/84  Pulse: 75  Resp: 16  Temp: 98.7 F (37.1 C)   Filed Weights   03/17/16 1434  Weight: 152 lb (68.9 kg)   Body mass index is 26.93 kg/m.   Physical Exam  Constitutional: She appears well-developed and well-nourished. No distress.  HENT:  Head: Normocephalic and atraumatic.  Eyes: Conjunctivae are normal.  Musculoskeletal: She exhibits no edema.  Skin: Skin is warm and dry. Rash (macular papular rash on chest, areas of excoriation on arms and back from  itching, no hives) noted. She is not diaphoretic.          Assessment & Plan:    See Problem List for Assessment and Plan of chronic medical problems.

## 2016-03-17 NOTE — Assessment & Plan Note (Signed)
Macular - papular rash on chest from perfume Avoid perfume or scented products Products for sensitive skin only Will refer to allergy Zyrtec daily at nighttime for relief of itching

## 2016-03-17 NOTE — Assessment & Plan Note (Signed)
Throughout body - mostly arms, chest and back ? Allergy related Avoid perfume or scented products Products for sensitive skin only Will refer to allergy Zyrtec daily at nighttime for relief of itching

## 2016-03-20 ENCOUNTER — Other Ambulatory Visit: Payer: Self-pay | Admitting: Gastroenterology

## 2016-03-20 DIAGNOSIS — R141 Gas pain: Secondary | ICD-10-CM

## 2016-03-20 DIAGNOSIS — R140 Abdominal distension (gaseous): Secondary | ICD-10-CM

## 2016-03-27 ENCOUNTER — Ambulatory Visit
Admission: RE | Admit: 2016-03-27 | Discharge: 2016-03-27 | Disposition: A | Discharge status: Home / Self Care | Payer: 59 | Source: Ambulatory Visit | Attending: Gastroenterology | Admitting: Gastroenterology

## 2016-03-27 DIAGNOSIS — R140 Abdominal distension (gaseous): Secondary | ICD-10-CM

## 2016-03-27 DIAGNOSIS — R141 Gas pain: Secondary | ICD-10-CM

## 2016-04-01 ENCOUNTER — Other Ambulatory Visit: Payer: PRIVATE HEALTH INSURANCE

## 2016-05-04 ENCOUNTER — Ambulatory Visit: Payer: Self-pay | Admitting: Allergy and Immunology

## 2016-05-04 ENCOUNTER — Ambulatory Visit (INDEPENDENT_AMBULATORY_CARE_PROVIDER_SITE_OTHER): Payer: 59 | Admitting: Emergency Medicine

## 2016-05-04 DIAGNOSIS — Z23 Encounter for immunization: Secondary | ICD-10-CM | POA: Diagnosis not present

## 2016-05-11 ENCOUNTER — Ambulatory Visit (INDEPENDENT_AMBULATORY_CARE_PROVIDER_SITE_OTHER): Payer: 59 | Admitting: Allergy and Immunology

## 2016-05-11 ENCOUNTER — Encounter: Payer: Self-pay | Admitting: Allergy and Immunology

## 2016-05-11 VITALS — BP 120/78 | HR 90 | Temp 98.8°F | Resp 16 | Ht 63.0 in | Wt 151.0 lb

## 2016-05-11 DIAGNOSIS — J3089 Other allergic rhinitis: Secondary | ICD-10-CM

## 2016-05-11 DIAGNOSIS — T7800XA Anaphylactic reaction due to unspecified food, initial encounter: Secondary | ICD-10-CM | POA: Diagnosis not present

## 2016-05-11 DIAGNOSIS — L500 Allergic urticaria: Secondary | ICD-10-CM | POA: Diagnosis not present

## 2016-05-11 MED ORDER — EPINEPHRINE 0.3 MG/0.3ML IJ SOAJ: 0.3000 mg | Freq: Once | INTRAMUSCULAR | 0 refills | Status: DC

## 2016-05-11 MED ORDER — FLUTICASONE PROPIONATE 50 MCG/ACT NA SUSP: 2.0000 | Freq: Every day | NASAL | 5 refills | Status: DC | PRN

## 2016-05-11 MED ORDER — MONTELUKAST SODIUM 10 MG PO TABS: 10.0000 mg | ORAL_TABLET | Freq: Every day | ORAL | 5 refills | Status: DC

## 2016-05-11 MED ORDER — LEVOCETIRIZINE DIHYDROCHLORIDE 5 MG PO TABS: 5.0000 mg | ORAL_TABLET | Freq: Every evening | ORAL | 5 refills | Status: DC

## 2016-05-11 NOTE — Assessment & Plan Note (Signed)
The patient's history suggests shellfish allergy and positive skin test results today confirm this diagnosis.  Meticulous avoidance of shellfish as discussed.  A prescription has been provided for epinephrine auto-injector 2 pack along with instructions for proper administration.  A food allergy action plan has been provided and discussed.  Medic Alert identification is recommended. 

## 2016-05-11 NOTE — Patient Instructions (Addendum)
Recurrent urticaria The patient's history and skin test results suggest allergic urticaria secondary to fragrance and/or aeroallergen exposure. Skin tests to select food allergens were negative today with the exception of shrimp, crab, lobster which she has been avoiding for quite some time and therefore not contributing to her ongoing symptoms. NSAIDs commonly exacerbate urticaria but are not the underlying etiology in this case. Emotional stress may be contributing. Physical urticarias are negative by history (i.e. pressure-induced, temperature, vibration, solar, etc.). There are no concomitant symptoms concerning for anaphylaxis or constitutional symptoms worrisome for an underlying malignancy.   Kathleen Ortiz will carefully avoid perfumes and scented toiletries as well as environmental allergens to the best of her ability over the next 4-6 weeks.  We will not order labs at this time, however, if lesions recur, persist, progress, or change in character in the absence of pollen exposure, we will assess potential etiologies with screening labs.  For symptom relief, patient is to take oral antihistamines as directed.  A prescription has been provided for levocetirizine 5 mg daily as needed.  A prescription has been provided for montelukast 10 mg daily at bedtime.  Should symptoms recur in the absence of pollen exposure, a journal is to be kept recording any foods eaten, beverages consumed, medications taken within a 6 hour period prior to the onset of symptoms, as well as record activities being performed, and environmental conditions. For any symptoms concerning for anaphylaxis, epinephrine is to be administered and 911 is to be called immediately.  Food allergy The patient's history suggests shellfish allergy and positive skin test results today confirm this diagnosis.  Meticulous avoidance of shellfish as discussed.  A prescription has been provided for epinephrine auto-injector 2 pack along with  instructions for proper administration.  A food allergy action plan has been provided and discussed.  Medic Alert identification is recommended.  Perennial and seasonal allergic rhinitis  Aeroallergen avoidance measures have been discussed and provided in written form.  Levocetirizine and montelukast have been prescribed (as above).  A prescription has been provided for fluticasone nasal spray, 2 sprays per nostril daily as needed. Proper nasal spray technique has been discussed and demonstrated.  I have also recommended nasal saline spray (i.e., Simply Saline) or nasal saline lavage (i.e., NeilMed) as needed prior to medicated nasal sprays.  If allergen avoidance measures and medications fail to adequately relieve symptoms, aeroallergen immunotherapy will be considered.   Return in about 6 weeks (around 06/22/2016), or if symptoms worsen or fail to improve.  Urticaria (Hives)  . Levocetirizine (Xyzal) 5 mg twice a day and ranitidine (Zantac) 150 mg twice a day. If no symptoms for 7-14 days then decrease to. . Levocetirizine (Xyzal) 5 mg twice a day and ranitidine (Zantac) 150 mg once a day.  If no symptoms for 7-14 days then decrease to. . Levocetirizine (Xyzal) 5 mg twice a day.  If no symptoms for 7-14 days then decrease to. . Levocetirizine (Xyzal) 5 mg once a day.  May use Benadryl (diphenhydramine) as needed for breakthrough symptoms       If symptoms return, then step up dosage  Reducing Pollen Exposure  The American Academy of Allergy, Asthma and Immunology suggests the following steps to reduce your exposure to pollen during allergy seasons.    1. Do not hang sheets or clothing out to dry; pollen may collect on these items. 2. Do not mow lawns or spend time around freshly cut grass; mowing stirs up pollen. 3. Keep windows closed at night.  Keep car windows closed while driving. 4. Minimize morning activities outdoors, a time when pollen counts are usually at their  highest. 5. Stay indoors as much as possible when pollen counts or humidity is high and on windy days when pollen tends to remain in the air longer. 6. Use air conditioning when possible.  Many air conditioners have filters that trap the pollen spores. 7. Use a HEPA room air filter to remove pollen form the indoor air you breathe.   Control of House Dust Mite Allergen  House dust mites play a major role in allergic asthma and rhinitis.  They occur in environments with high humidity wherever human skin, the food for dust mites is found. High levels have been detected in dust obtained from mattresses, pillows, carpets, upholstered furniture, bed covers, clothes and soft toys.  The principal allergen of the house dust mite is found in its feces.  A gram of dust may contain 1,000 mites and 250,000 fecal particles.  Mite antigen is easily measured in the air during house cleaning activities.    1. Encase mattresses, including the box spring, and pillow, in an air tight cover.  Seal the zipper end of the encased mattresses with wide adhesive tape. 2. Wash the bedding in water of 130 degrees Farenheit weekly.  Avoid cotton comforters/quilts and flannel bedding: the most ideal bed covering is the dacron comforter. 3. Remove all upholstered furniture from the bedroom. 4. Remove carpets, carpet padding, rugs, and non-washable window drapes from the bedroom.  Wash drapes weekly or use plastic window coverings. 5. Remove all non-washable stuffed toys from the bedroom.  Wash stuffed toys weekly. 6. Have the room cleaned frequently with a vacuum cleaner and a damp dust-mop.  The patient should not be in a room which is being cleaned and should wait 1 hour after cleaning before going into the room. 7. Close and seal all heating outlets in the bedroom.  Otherwise, the room will become filled with dust-laden air.  An electric heater can be used to heat the room. Reduce indoor humidity to less than 50%.  Do not use  a humidifier.  Control of Dog or Cat Allergen  Avoidance is the best way to manage a dog or cat allergy. If you have a dog or cat and are allergic to dog or cats, consider removing the dog or cat from the home. If you have a dog or cat but don't want to find it a new home, or if your family wants a pet even though someone in the household is allergic, here are some strategies that may help keep symptoms at bay:  1. Keep the pet out of your bedroom and restrict it to only a few rooms. Be advised that keeping the dog or cat in only one room will not limit the allergens to that room. 2. Don't pet, hug or kiss the dog or cat; if you do, wash your hands with soap and water. 3. High-efficiency particulate air (HEPA) cleaners run continuously in a bedroom or living room can reduce allergen levels over time. 4. Regular use of a high-efficiency vacuum cleaner or a central vacuum can reduce allergen levels. 5. Giving your dog or cat a bath at least once a week can reduce airborne allergen.  Control of Mold Allergen  Mold and fungi can grow on a variety of surfaces provided certain temperature and moisture conditions exist.  Outdoor molds grow on plants, decaying vegetation and soil.  The major outdoor mold, Alternaria  and Cladosporium, are found in very high numbers during hot and dry conditions.  Generally, a late Summer - Fall peak is seen for common outdoor fungal spores.  Rain will temporarily lower outdoor mold spore count, but counts rise rapidly when the rainy period ends.  The most important indoor molds are Aspergillus and Penicillium.  Dark, humid and poorly ventilated basements are ideal sites for mold growth.  The next most common sites of mold growth are the bathroom and the kitchen.  Outdoor [employer] 1. Use air conditioning and keep windows closed 2. Avoid exposure to decaying vegetation. 3. Avoid leaf raking. 4. Avoid grain handling. 5. Consider wearing a face mask if working in moldy  areas.  Indoor Mold Control 1. Maintain humidity below 50%. 2. Clean washable surfaces with 5% bleach solution. 3. Remove sources e.g. Contaminated carpets.  Control of Cockroach Allergen  Cockroach allergen has been identified as an important cause of acute attacks of asthma, especially in urban settings.  There are fifty-five species of cockroach that exist in the [place], however only three, the [place], [place] species produce allergen that can affect patients with Asthma.  Allergens can be obtained from fecal particles, egg casings and secretions from cockroaches.    1. Remove food sources. 2. Reduce access to water. 3. Seal access and entry points. 4. Spray runways with 0.5-1% Diazinon or Chlorpyrifos 5. Blow boric acid power under stoves and refrigerator. 6. Place bait stations (hydramethylnon) at feeding sites.

## 2016-05-11 NOTE — Progress Notes (Signed)
New Patient Note  RE: Kathleen Ortiz MRN: [id number] DOB: 1970/12/17 Date of Office Visit: 05/11/2016  Referring provider: Pincus Sanes, MD Primary care provider: Pincus Sanes, MD  Chief Complaint: Urticaria; Pruritus; and Allergic Rhinitis    History of present illness: Kathleen Ortiz is a 46 y.o. female seen today in consultation requested by Cheryll Cockayne, MD. Since 1995, Kathleen Ortiz has experienced recurrent episodes of hives. The hives have progressed over the past year. Hives occur daily. Typical distribution includes the back, arms and abdomen.  The lesions are described as erythematous, raised, and pruritic.  Individual hives last less than 24 hours without leaving residual pigmentation or bruising. She denies concomitant angioedema, cardiopulmonary symptoms and GI symptoms. She has not experienced unexpected weight loss, recurrent fevers or drenching night sweats. No specific medication or food triggers have been identified.  She notes that she no longer eats shrimp because of associated hives and other untoward symptoms.  However, she has been experiencing ongoing episodes of hives in the absence of shrimp, crab, or lobster.  She is uncertain if perfume and certain scented skin care products, detergents, and soaps may be contributing. She has not tried scent-free cosmetics/toiletries or eliminating perfumes. The symptoms do not seem to correlate with NSAIDs use.  Emotional stress may exacerbate the hives. [name] has tried to control symptoms with topical corticosteroids which have offered minimal temporary relief of symptoms. She has not been evaluated and treated in the emergency department for these symptoms. Skin biopsy has been performed but was unrevealing. Kathleen Ortiz experiences nasal congestion, rhinorrhea, sneezing, nasal pruritus, and ocular pruritus.  The symptoms occur year around but tend to be more frequent and severe in the summer and fall.   Assessment and  plan: Recurrent urticaria The patient's history and skin test results suggest allergic urticaria secondary to fragrance and/or aeroallergen exposure. Skin tests to select food allergens were negative today with the exception of shrimp, crab, lobster which she has been avoiding for quite some time and therefore not contributing to her ongoing symptoms. NSAIDs commonly exacerbate urticaria but are not the underlying etiology in this case. Emotional stress may be contributing. Physical urticarias are negative by history (i.e. pressure-induced, temperature, vibration, solar, etc.). There are no concomitant symptoms concerning for anaphylaxis or constitutional symptoms worrisome for an underlying malignancy.   Kathleen Ortiz will carefully avoid perfumes and scented toiletries as well as environmental allergens to the best of her ability over the next 4-6 weeks.  We will not order labs at this time, however, if lesions recur, persist, progress, or change in character in the absence of pollen exposure, we will assess potential etiologies with screening labs.  For symptom relief, patient is to take oral antihistamines as directed.  A prescription has been provided for levocetirizine 5 mg daily as needed.  A prescription has been provided for montelukast 10 mg daily at bedtime.  Should symptoms recur in the absence of pollen exposure, a journal is to be kept recording any foods eaten, beverages consumed, medications taken within a 6 hour period prior to the onset of symptoms, as well as record activities being performed, and environmental conditions. For any symptoms concerning for anaphylaxis, epinephrine is to be administered and 911 is to be called immediately.  Food allergy The patient's history suggests shellfish allergy and positive skin test results today confirm this diagnosis.  Meticulous avoidance of shellfish as discussed.  A prescription has been provided for epinephrine auto-injector 2 pack along  with instructions for proper  administration.  A food allergy action plan has been provided and discussed.  Medic Alert identification is recommended.  Perennial and seasonal allergic rhinitis  Aeroallergen avoidance measures have been discussed and provided in written form.  Levocetirizine and montelukast have been prescribed (as above).  A prescription has been provided for fluticasone nasal spray, 2 sprays per nostril daily as needed. Proper nasal spray technique has been discussed and demonstrated.  I have also recommended nasal saline spray (i.e., Simply Saline) or nasal saline lavage (i.e., NeilMed) as needed prior to medicated nasal sprays.  If allergen avoidance measures and medications fail to adequately relieve symptoms, aeroallergen immunotherapy will be considered.   Meds ordered this encounter  Medications  . EPINEPHrine 0.3 mg/0.3 mL IJ SOAJ injection    Sig: Inject 0.3 mLs (0.3 mg total) into the muscle once.    Dispense:  2 Device    Refill:  0  . levocetirizine (XYZAL) 5 MG tablet    Sig: Take 1 tablet (5 mg total) by mouth every evening.    Dispense:  30 tablet    Refill:  5  . montelukast (SINGULAIR) 10 MG tablet    Sig: Take 1 tablet (10 mg total) by mouth at bedtime.    Dispense:  30 tablet    Refill:  5  . fluticasone (FLONASE) 50 MCG/ACT nasal spray    Sig: Place 2 sprays into both nostrils daily as needed for allergies or rhinitis.    Dispense:  16 g    Refill:  5    Diagnostics: Environmental skin testing: Positive to grass pollens, tree pollens, molds, cat hair, cockroach antigen, and dust mite antigen. Food allergen skin testing: Positive to shrimp, crab, and lobster.    Physical examination: Blood pressure 120/78, pulse 90, temperature 98.8 F (37.1 C), temperature source Oral, resp. rate 16, height 5\' 3"  (1.6 m), weight 151 lb (68.5 kg), SpO2 98 %.  General: Alert, interactive, in no acute distress. HEENT: TMs pearly gray, turbinates mildly  edematous without discharge, post-pharynx mildly erythematous. Neck: Supple without lymphadenopathy. Lungs: Clear to auscultation without wheezing, rhonchi or rales. CV: Normal S1, S2 without murmurs. Abdomen: Nondistended, nontender. Skin: Warm and dry, without lesions or rashes. Extremities:  No clubbing, cyanosis or edema. Neuro:   Grossly intact.  Review of systems:  Review of systems negative except as noted in HPI / PMHx or noted below: Review of Systems  Constitutional: Negative.   HENT: Negative.   Eyes: Negative.   Respiratory: Negative.   Cardiovascular: Negative.   Gastrointestinal: Negative.   Genitourinary: Negative.   Musculoskeletal: Negative.   Skin: Negative.   Neurological: Negative.   Endo/Heme/Allergies: Negative.   Psychiatric/Behavioral: Negative.     Past medical history:  Past Medical History:  Diagnosis Date  . Chronic bronchitis (HCC)   . Eczema   . GERD (gastroesophageal reflux disease)   . Urticaria     Past surgical history:  Past Surgical History:  Procedure Laterality Date  . TYMPANOSTOMY TUBE PLACEMENT      Family history: Family History  Problem Relation Age of Onset  . Arthritis Mother   . Cancer Mother     Lung & colon  . Hypertension Mother   . Diabetes Maternal Aunt   . Arthritis Maternal Grandmother   . Hypertension Maternal Grandmother   . Heart disease Maternal Grandmother   . Diabetes Maternal Grandmother   . Cancer Paternal Grandfather     Social history: Social History   Social History  .  Marital status: Single    Spouse name: N/A  . Number of children: N/A  . Years of education: N/A   Occupational History  . Not on file.   Social History Main Topics  . Smoking status: Current Every Day Smoker    Packs/day: 0.10    Types: Cigarettes  . Smokeless tobacco: Never Used  . Alcohol use Yes  . Drug use: No  . Sexual activity: Not on file   Other Topics Concern  . Not on file   Social History Narrative     No regular exercise         Environmental History: The patient lives in a 45 year old house with carpeting throughout and central air/heat.  There is a dog in house which has access to her bedroom.  Allergies as of 05/11/2016      Reactions   Latex Rash      Medication List       Accurate as of 05/11/16  1:16 PM. Always use your most recent med list.          ALLEGRA PO Take by mouth daily.   clindamycin 1 % lotion Commonly known as:  CLEOCIN T Apply topically 2 (two) times daily.   DAYQUIL PO Take by mouth as needed.   EPINEPHrine 0.3 mg/0.3 mL Soaj injection Commonly known as:  EPI-PEN Inject 0.3 mLs (0.3 mg total) into the muscle once.   fluticasone 50 MCG/ACT nasal spray Commonly known as:  FLONASE Place 2 sprays into both nostrils daily as needed for allergies or rhinitis.   levocetirizine 5 MG tablet Commonly known as:  XYZAL Take 1 tablet (5 mg total) by mouth every evening.   montelukast 10 MG tablet Commonly known as:  SINGULAIR Take 1 tablet (10 mg total) by mouth at bedtime.   traMADol 50 MG tablet Commonly known as:  ULTRAM Take 50 mg by mouth every 6 (six) hours as needed.   VIMOVO 500-20 MG Tbec Generic drug:  Naproxen-Esomeprazole Take 1 tablet by mouth 2 (two) times daily as needed.   Vitamin D (Cholecalciferol) 1000 units Caps Take 2,000 Units by mouth 3 (three) times a week.       Known medication allergies: Allergies  Allergen Reactions  . Latex Rash    I appreciate the opportunity to take part in Kathleen Ortiz's care. Please do not hesitate to contact me with questions.  Sincerely,   R. Jorene Guestarter Jameire Kouba, MD

## 2016-05-11 NOTE — Assessment & Plan Note (Signed)
   Aeroallergen avoidance measures have been discussed and provided in written form.  Levocetirizine and montelukast have been prescribed (as above).  A prescription has been provided for fluticasone nasal spray, 2 sprays per nostril daily as needed. Proper nasal spray technique has been discussed and demonstrated.  I have also recommended nasal saline spray (i.e., Simply Saline) or nasal saline lavage (i.e., NeilMed) as needed prior to medicated nasal sprays.  If allergen avoidance measures and medications fail to adequately relieve symptoms, aeroallergen immunotherapy will be considered.

## 2016-05-11 NOTE — Assessment & Plan Note (Addendum)
The patient's history and skin test results suggest allergic urticaria secondary to fragrance and/or aeroallergen exposure. Skin tests to select food allergens were negative today with the exception of shrimp, crab, lobster which she has been avoiding for quite some time and therefore not contributing to her ongoing symptoms. NSAIDs commonly exacerbate urticaria but are not the underlying etiology in this case. Emotional stress may be contributing. Physical urticarias are negative by history (i.e. pressure-induced, temperature, vibration, solar, etc.). There are no concomitant symptoms concerning for anaphylaxis or constitutional symptoms worrisome for an underlying malignancy.   Kathleen Ortiz will carefully avoid perfumes and scented toiletries as well as environmental allergens to the best of her ability over the next 4-6 weeks.  We will not order labs at this time, however, if lesions recur, persist, progress, or change in character in the absence of pollen exposure, we will assess potential etiologies with screening labs.  For symptom relief, patient is to take oral antihistamines as directed.  A prescription has been provided for levocetirizine 5 mg daily as needed.  A prescription has been provided for montelukast 10 mg daily at bedtime.  Should symptoms recur in the absence of pollen exposure, a journal is to be kept recording any foods eaten, beverages consumed, medications taken within a 6 hour period prior to the onset of symptoms, as well as record activities being performed, and environmental conditions. For any symptoms concerning for anaphylaxis, epinephrine is to be administered and 911 is to be called immediately.

## 2016-08-02 NOTE — Progress Notes (Signed)
Subjective:    Patient ID: Kathleen Ortiz, female    DOB: 26-Aug-1970, 46 y.o.   MRN: [id number]  HPI The patient is here for follow up.  She and her gynecologist was concerned she may have diabetes.    She states fatigue, nausea and sick on her stomach if she does not eat chocolate.  She will feel lightheaded at times - often before lunch. She has excessive urination, but denies excessive thirst.  She eats excessive sugars - candy.  She is exercising some, but not regularly.    Medications and allergies reviewed with patient and updated if appropriate.  Patient Active Problem List   Diagnosis Date Noted  . Recurrent urticaria 05/11/2016  . Food allergy 05/11/2016  . Itching 03/17/2016  . Rash and nonspecific skin eruption 03/17/2016  . Right shoulder pain 08/02/2015  . Perennial and seasonal allergic rhinitis 03/20/2015  . Vitamin D deficiency 03/20/2015  . Pain of both shoulder joints 03/20/2015  . Eczema 03/20/2015    Current Outpatient Prescriptions on File Prior to Visit  Medication Sig Dispense Refill  . clindamycin (CLEOCIN T) 1 % lotion Apply topically 2 (two) times daily.    Marland Kitchen Fexofenadine HCl (ALLEGRA PO) Take by mouth daily.    . fluticasone (FLONASE) 50 MCG/ACT nasal spray Place 2 sprays into both nostrils daily as needed for allergies or rhinitis. 16 g 5  . levocetirizine (XYZAL) 5 MG tablet Take 1 tablet (5 mg total) by mouth every evening. 30 tablet 5  . montelukast (SINGULAIR) 10 MG tablet Take 1 tablet (10 mg total) by mouth at bedtime. 30 tablet 5  . Pseudoephedrine-APAP-DM (DAYQUIL PO) Take by mouth as needed.    . traMADol (ULTRAM) 50 MG tablet Take 50 mg by mouth every 6 (six) hours as needed.    Marland Kitchen VIMOVO 500-20 MG TBEC Take 1 tablet by mouth 2 (two) times daily as needed. 60 tablet 1  . Vitamin D, Cholecalciferol, 1000 UNITS CAPS Take 2,000 Units by mouth 3 (three) times a week.     No current facility-administered medications on file prior to visit.      Past Medical History:  Diagnosis Date  . Chronic bronchitis (HCC)   . Eczema   . GERD (gastroesophageal reflux disease)   . Urticaria     Past Surgical History:  Procedure Laterality Date  . TYMPANOSTOMY TUBE PLACEMENT      Social History   Social History  . Marital status: Single    Spouse name: N/A  . Number of children: N/A  . Years of education: N/A   Social History Main Topics  . Smoking status: Current Every Day Smoker    Packs/day: 0.10    Types: Cigarettes  . Smokeless tobacco: Never Used  . Alcohol use Yes  . Drug use: No  . Sexual activity: Not on file   Other Topics Concern  . Not on file   Social History Narrative   No regular exercise          Family History  Problem Relation Age of Onset  . Arthritis Mother   . Cancer Mother     Lung & colon  . Hypertension Mother   . Diabetes Maternal Aunt   . Arthritis Maternal Grandmother   . Hypertension Maternal Grandmother   . Heart disease Maternal Grandmother   . Diabetes Maternal Grandmother   . Cancer Paternal Grandfather     Review of Systems  Constitutional: Positive for fatigue. Negative for fever.  Eyes: Negative for visual disturbance.  Respiratory: Negative for cough, shortness of breath and wheezing.   Cardiovascular: Positive for palpitations (rare). Negative for chest pain.  Gastrointestinal: Positive for nausea. Negative for abdominal pain.  Endocrine: Positive for polydipsia. Negative for polyuria.  Neurological: Positive for light-headedness (often before lunch) and headaches.       Objective:   Vitals:   08/03/16 0826  BP: (!) 130/94  Pulse: 74  Resp: 16  Temp: 98.6 F (37 C)   Wt Readings from Last 3 Encounters:  08/03/16 155 lb (70.3 kg)  05/11/16 151 lb (68.5 kg)  03/17/16 152 lb (68.9 kg)   Body mass index is 27.46 kg/m.   Physical Exam    Constitutional: Appears well-developed and well-nourished. No distress.  HENT:  Head: Normocephalic and  atraumatic.  Neck: Neck supple. No tracheal deviation present. No thyromegaly present.  No cervical lymphadenopathy Cardiovascular: Normal rate, regular rhythm and normal heart sounds.   No murmur heard. No carotid bruit .  No edema Pulmonary/Chest: Effort normal and breath sounds normal. No respiratory distress. No has no wheezes. No rales.  Skin: Skin is warm and dry. Not diaphoretic.  Psychiatric: Normal mood and affect. Behavior is normal.      Assessment & Plan:    See Problem List for Assessment and Plan of chronic medical problems.

## 2016-08-03 ENCOUNTER — Encounter: Payer: Self-pay | Admitting: Internal Medicine

## 2016-08-03 ENCOUNTER — Ambulatory Visit (INDEPENDENT_AMBULATORY_CARE_PROVIDER_SITE_OTHER): Payer: 59 | Admitting: Internal Medicine

## 2016-08-03 ENCOUNTER — Other Ambulatory Visit (INDEPENDENT_AMBULATORY_CARE_PROVIDER_SITE_OTHER): Payer: 59

## 2016-08-03 VITALS — BP 130/94 | HR 74 | Temp 98.6°F | Resp 16 | Wt 155.0 lb

## 2016-08-03 DIAGNOSIS — R030 Elevated blood-pressure reading, without diagnosis of hypertension: Secondary | ICD-10-CM | POA: Diagnosis not present

## 2016-08-03 DIAGNOSIS — R42 Dizziness and giddiness: Secondary | ICD-10-CM

## 2016-08-03 DIAGNOSIS — R110 Nausea: Secondary | ICD-10-CM | POA: Diagnosis not present

## 2016-08-03 DIAGNOSIS — K219 Gastro-esophageal reflux disease without esophagitis: Secondary | ICD-10-CM | POA: Insufficient documentation

## 2016-08-03 DIAGNOSIS — Z9189 Other specified personal risk factors, not elsewhere classified: Secondary | ICD-10-CM

## 2016-08-03 DIAGNOSIS — R5383 Other fatigue: Secondary | ICD-10-CM | POA: Diagnosis not present

## 2016-08-03 LAB — CBC WITH DIFFERENTIAL/PLATELET
Basophils Absolute: 0 10*3/uL (ref 0.0–0.1)
Basophils Relative: 1.2 % (ref 0.0–3.0)
Eosinophils Absolute: 0.2 10*3/uL (ref 0.0–0.7)
Eosinophils Relative: 5.1 % — ABNORMAL HIGH (ref 0.0–5.0)
HCT: 40 % (ref 36.0–46.0)
Hemoglobin: 13.4 g/dL (ref 12.0–15.0)
Lymphocytes Relative: 37.3 % (ref 12.0–46.0)
Lymphs Abs: 1.4 10*3/uL (ref 0.7–4.0)
MCHC: 33.6 g/dL (ref 30.0–36.0)
MCV: 90 fl (ref 78.0–100.0)
Monocytes Absolute: 0.3 10*3/uL (ref 0.1–1.0)
Monocytes Relative: 7.1 % (ref 3.0–12.0)
Neutro Abs: 1.9 10*3/uL (ref 1.4–7.7)
Neutrophils Relative %: 49.3 % (ref 43.0–77.0)
Platelets: 181 10*3/uL (ref 150.0–400.0)
RBC: 4.44 Mil/uL (ref 3.87–5.11)
RDW: 12.7 % (ref 11.5–15.5)
WBC: 3.8 10*3/uL — ABNORMAL LOW (ref 4.0–10.5)

## 2016-08-03 LAB — COMPREHENSIVE METABOLIC PANEL
ALBUMIN: 4.5 g/dL (ref 3.5–5.2)
ALT: 14 U/L (ref 0–35)
AST: 14 U/L (ref 0–37)
Alkaline Phosphatase: 69 U/L (ref 39–117)
BUN: 11 mg/dL (ref 6–23)
CALCIUM: 9.4 mg/dL (ref 8.4–10.5)
CHLORIDE: 105 meq/L (ref 96–112)
CO2: 25 meq/L (ref 19–32)
Creatinine, Ser: 0.71 mg/dL (ref 0.40–1.20)
GFR: 114.48 mL/min (ref >60.00)
Glucose, Bld: 88 mg/dL (ref 70–99)
Potassium: 3.8 mEq/L (ref 3.5–5.1)
Sodium: 138 mEq/L (ref 135–145)
Total Bilirubin: 0.6 mg/dL (ref 0.2–1.2)
Total Protein: 7.8 g/dL (ref 6.0–8.3)

## 2016-08-03 LAB — LIPID PANEL
CHOL/HDL RATIO: 3
CHOLESTEROL: 167 mg/dL (ref 0–200)
HDL: 57.3 mg/dL (ref >39.00)
LDL CALC: 97 mg/dL (ref 0–99)
NonHDL: 109.56
TRIGLYCERIDES: 65 mg/dL (ref 0.0–149.0)
VLDL: 13 mg/dL (ref 0.0–40.0)

## 2016-08-03 LAB — TSH: TSH: 0.79 u[IU]/mL (ref 0.35–4.50)

## 2016-08-03 LAB — HEMOGLOBIN A1C: Hgb A1c MFr Bld: 5.6 % (ref 4.6–6.5)

## 2016-08-03 NOTE — Assessment & Plan Note (Signed)
Intermittent - often before lunch ? Hypoglycemia Check labs

## 2016-08-03 NOTE — Assessment & Plan Note (Signed)
Positive family hx, consuming excessive sugars (candy), exercising irregularly Stressed improving diet - decrease sugar/carb intake.  Exercising regularly and working on weight loss.  Discussed her risk of diabetes Check a1c and other basic labs

## 2016-08-03 NOTE — Assessment & Plan Note (Signed)
Check labs She is concerned it may be from high/low sugars

## 2016-08-03 NOTE — Assessment & Plan Note (Signed)
BP slightly elevated here today - usually controlled, but diastolic has been borderline high No need for medication at this time - will monitor Check basic labs

## 2016-08-03 NOTE — Progress Notes (Signed)
Pre visit review using our clinic review tool, if applicable. No additional management support is needed unless otherwise documented below in the visit note. 

## 2016-08-03 NOTE — Assessment & Plan Note (Signed)
GERD controlled Continue daily medication  

## 2016-08-03 NOTE — Assessment & Plan Note (Signed)
related to low/high sugars Seems to be better after eating sugar Check labs

## 2016-08-03 NOTE — Patient Instructions (Addendum)
Fasting sugar/glucose - 70-99 (normal) 100-125 - prediabetic, 126 or higher - diabetic  A1c (sugar control over the past three months) - 5.6% or less - normal, 5.7% - 6.4% - prediabetes, 6.5% or higher  - diabetes   Have blood work done today.      Diabetes Mellitus and Exercise Exercising regularly is important for your overall health, especially when you have diabetes (diabetes mellitus). Exercising is not only about losing weight. It has many health benefits, such as increasing muscle strength and bone density and reducing body fat and stress. This leads to improved fitness, flexibility, and endurance, all of which result in better overall health. Exercise has additional benefits for people with diabetes, including:  Reducing appetite.  Helping to lower and control blood glucose.  Lowering blood pressure.  Helping to control amounts of fatty substances (lipids) in the blood, such as cholesterol and triglycerides.  Helping the body to respond better to insulin (improving insulin sensitivity).  Reducing how much insulin the body needs.  Decreasing the risk for heart disease by:  Lowering cholesterol and triglyceride levels.  Increasing the levels of good cholesterol.  Lowering blood glucose levels. What is my activity plan? Your health care provider or certified diabetes educator can help you make a plan for the type and frequency of exercise (activity plan) that works for you. Make sure that you:  Do at least 150 minutes of moderate-intensity or vigorous-intensity exercise each week. This could be brisk walking, biking, or water aerobics.  Do stretching and strength exercises, such as yoga or weightlifting, at least 2 times a week.  Spread out your activity over at least 3 days of the week.  Get some form of physical activity every day.  Do not go more than 2 days in a row without some kind of physical activity.  Avoid being inactive for more than 90 minutes at a time.  Take frequent breaks to walk or stretch.  Choose a type of exercise or activity that you enjoy, and set realistic goals.  Start slowly, and gradually increase the intensity of your exercise over time. What do I need to know about managing my diabetes?  Check your blood glucose before and after exercising.  If your blood glucose is higher than 240 mg/dL (16.1 mmol/L) before you exercise, check your urine for ketones. If you have ketones in your urine, do not exercise until your blood glucose returns to normal.  Know the symptoms of low blood glucose (hypoglycemia) and how to treat it. Your risk for hypoglycemia increases during and after exercise. Common symptoms of hypoglycemia can include:  Hunger.  Anxiety.  Sweating and feeling clammy.  Confusion.  Dizziness or feeling light-headed.  Increased heart rate or palpitations.  Blurry vision.  Tingling or numbness around the mouth, lips, or tongue.  Tremors or shakes.  Irritability.  Keep a rapid-acting carbohydrate snack available before, during, and after exercise to help prevent or treat hypoglycemia.  Avoid injecting insulin into areas of the body that are going to be exercised. For example, avoid injecting insulin into:  The arms, when playing tennis.  The legs, when jogging.  Keep records of your exercise habits. Doing this can help you and your health care provider adjust your diabetes management plan as needed. Write down:  Food that you eat before and after you exercise.  Blood glucose levels before and after you exercise.  The type and amount of exercise you have done.  When your insulin is expected to peak,  if you use insulin. Avoid exercising at times when your insulin is peaking.  When you start a new exercise or activity, work with your health care provider to make sure the activity is safe for you, and to adjust your insulin, medicines, or food intake as needed.  Drink plenty of water while you  exercise to prevent dehydration or heat stroke. Drink enough fluid to keep your urine clear or pale yellow. This information is not intended to replace advice given to you by your health care provider. Make sure you discuss any questions you have with your health care provider. Document Released: 06/13/2003 Document Revised: 10/11/2015 Document Reviewed: 09/02/2015 Elsevier Interactive Patient Education  2017 [employer].

## 2016-10-10 ENCOUNTER — Encounter (HOSPITAL_COMMUNITY): Payer: Self-pay | Admitting: *Deleted

## 2016-10-10 ENCOUNTER — Ambulatory Visit (HOSPITAL_COMMUNITY)
Admission: EM | Admit: 2016-10-10 | Discharge: 2016-10-10 | Disposition: A | Discharge status: Home / Self Care | Payer: 59 | Attending: Family Medicine | Admitting: Family Medicine

## 2016-10-10 DIAGNOSIS — R05 Cough: Secondary | ICD-10-CM

## 2016-10-10 DIAGNOSIS — R059 Cough, unspecified: Secondary | ICD-10-CM

## 2016-10-10 MED ORDER — HYDROCODONE-HOMATROPINE 5-1.5 MG/5ML PO SYRP: 5.0000 mL | ORAL_SOLUTION | Freq: Four times a day (QID) | ORAL | 0 refills | Status: DC | PRN

## 2016-10-10 MED ORDER — AZITHROMYCIN 250 MG PO TABS: 250.0000 mg | ORAL_TABLET | Freq: Every day | ORAL | 0 refills | Status: DC

## 2016-10-10 NOTE — ED Provider Notes (Signed)
  [facility]   [id number] 10/10/16 Arrival Time: 1408  ASSESSMENT & PLAN:  Today you were diagnosed with the following: 1. Cough    You have been prescribed prescription medications this visit.   Meds ordered this encounter  Medications  . azithromycin (ZITHROMAX) 250 MG tablet    Sig: Take 1 tablet (250 mg total) by mouth daily. Take first 2 tablets together, then 1 every day until finished.    Dispense:  6 tablet    Refill:  0  . HYDROcodone-homatropine (HYCODAN) 5-1.5 MG/5ML syrup    Sig: Take 5 mLs by mouth every 6 (six) hours as needed for cough.    Dispense:  90 mL    Refill:  0   Please take all medications as directed. Be aware that the cough medicine may cause drowsiness.  If you are not improving over the next few days or feel you are worsening please follow up here or the Emergency Department if you are unable to see your regular doctor.  Your blood pressure was noted to be elevated during your visit today. You may return here within the next few days or when you are well to recheck if unable to see your primary care doctor.  Reviewed expectations re: course of current medical issues. Questions answered. Outlined signs and symptoms indicating need for more acute intervention. Patient verbalized understanding. After Visit Summary given.   SUBJECTIVE:  Kathleen Ortiz is a 46 y.o. female who presents with complaint of persistent dry cough. Greater than one week with URI symptoms but most of those are improving. Cough worse at night and affecting sleep. OTC without relief. Occas allergy symptoms. Takes Zyrtec. Afebrile. No SOB or wheezing reported. No specific aggravating or alleviating factors reported..  ROS: As per HPI.   OBJECTIVE:  Vitals:   10/10/16 1436  BP: (!) 129/98  Pulse: 75  Resp: 18  Temp: 100 F (37.8 C)  TempSrc: Oral  SpO2: 100%     General appearance: alert; no distress Nose: mild nasal congestion Throat: lips, mucosa, and  tongue normal; teeth and gums normal Neck: supple; symmetrical; trachea midline Lungs: clear to auscultation bilaterally Heart: regular rate and rhythm    Allergies  Allergen Reactions  . Latex Rash    PMHx, SurgHx, SocialHx, Medications, and Allergies were reviewed in the Visit Navigator and updated as appropriate.      Mardella LaymanHagler, Mainor Hellmann, MD 10/10/16 [phone]

## 2016-10-10 NOTE — Discharge Instructions (Signed)
Today you were diagnosed with the following: 1. Cough    You have been prescribed prescription medications this visit.   Meds ordered this encounter  Medications   azithromycin (ZITHROMAX) 250 MG tablet    Sig: Take 1 tablet (250 mg total) by mouth daily. Take first 2 tablets together, then 1 every day until finished.    Dispense:  6 tablet    Refill:  0   HYDROcodone-homatropine (HYCODAN) 5-1.5 MG/5ML syrup    Sig: Take 5 mLs by mouth every 6 (six) hours as needed for cough.    Dispense:  90 mL    Refill:  0   Please take all medications as directed. Be aware that the cough medicine may cause drowsiness.  If you are not improving over the next few days or feel you are worsening please follow up here or the Emergency Department if you are unable to see your regular doctor.  Your blood pressure was noted to be elevated during your visit today. You may return here within the next few days or when you are well to recheck if unable to see your primary care doctor.

## 2016-10-10 NOTE — ED Triage Notes (Addendum)
Pt  Reports   Dry   Cough   X  4   Days   -  Prior  To  That  The  Pt  Had  Congestion   And    Allergy  Type  Symptoms      She   Also  Reports  Her feet   Are   Sore      She   States   She  Stands   At  Work  For  [employer]  Periods

## 2017-04-28 NOTE — Progress Notes (Signed)
Subjective:    Patient ID: Kathleen Ortiz, femaleGlyn Ortiz    DOB: 1970-05-11, 47 y.o.   MRN: [id number]  HPI She is here for an acute visit.   Cc:  She noticed that her urine has a smell to it for the past two weeks.  It looks like a powder on top of the urine.  Her urine is always dark because she does not drink enough water. She has increased her water.   She denies dysuria, urinary frequency, hematuria, fever/chills and lower abdominal pain.  She has some nausea.  She has some lower back pain, which she has had intermittently.  She started sertraline 3 weeks ago.  She is having some constipation, which is not new.     She has a strong family history of diabetes.  She was concerned about diabetes. She wondered if that was the cause of her urine symptoms.  She is not exercising.  She is not watching what she eats.   Medications and allergies reviewed with patient and updated if appropriate.  Patient Active Problem List   Diagnosis Date Noted  . Fatigue 08/03/2016  . Nausea 08/03/2016  . Lightheadedness 08/03/2016  . Gastroesophageal reflux disease 08/03/2016  . Elevated blood pressure reading 08/03/2016  . At risk of diabetes mellitus 08/03/2016  . Recurrent urticaria 05/11/2016  . Food allergy 05/11/2016  . Itching 03/17/2016  . Rash and nonspecific skin eruption 03/17/2016  . Right shoulder pain 08/02/2015  . Perennial and seasonal allergic rhinitis 03/20/2015  . Vitamin D deficiency 03/20/2015  . Pain of both shoulder joints 03/20/2015  . Eczema 03/20/2015    Current Outpatient Medications on File Prior to Visit  Medication Sig Dispense Refill  . clindamycin (CLEOCIN T) 1 % lotion Apply topically 2 (two) times daily.    . fluticasone (FLONASE) 50 MCG/ACT nasal spray Place 2 sprays into both nostrils daily as needed for allergies or rhinitis. 16 g 5  . levocetirizine (XYZAL) 5 MG tablet Take 1 tablet (5 mg total) by mouth every evening. 30 tablet 5  . omeprazole (PRILOSEC)  40 MG capsule Take 40 mg by mouth daily.    . Pseudoephedrine-APAP-DM (DAYQUIL PO) Take by mouth as needed.    . sertraline (ZOLOFT) 25 MG tablet Take 25 mg by mouth daily.    . traMADol (ULTRAM) 50 MG tablet Take 50 mg by mouth every 6 (six) hours as needed.    Marland Kitchen. VIMOVO 500-20 MG TBEC Take 1 tablet by mouth 2 (two) times daily as needed. 60 tablet 1  . Vitamin D, Cholecalciferol, 1000 UNITS CAPS Take 2,000 Units by mouth 3 (three) times a week.    . montelukast (SINGULAIR) 10 MG tablet Take 1 tablet (10 mg total) by mouth at bedtime. (Patient not taking: Reported on 04/29/2017) 30 tablet 5   No current facility-administered medications on file prior to visit.     Past Medical History:  Diagnosis Date  . Chronic bronchitis (HCC)   . Eczema   . GERD (gastroesophageal reflux disease)   . Urticaria     Past Surgical History:  Procedure Laterality Date  . TYMPANOSTOMY TUBE PLACEMENT      Social History   Socioeconomic History  . Marital status: Single    Spouse name: None  . Number of children: None  . Years of education: None  . Highest education level: None  Social Needs  . Financial resource strain: None  . Food insecurity - worry: None  . Food insecurity -  inability: None  . Transportation needs - medical: None  . Transportation needs - non-medical: None  Occupational History  . None  Tobacco Use  . Smoking status: Former Smoker    Packs/day: 0.10    Types: Cigarettes  . Smokeless tobacco: Never Used  Substance and Sexual Activity  . Alcohol use: Yes  . Drug use: No  . Sexual activity: None  Other Topics Concern  . None  Social History Narrative   No regular exercise       Family History  Problem Relation Age of Onset  . Arthritis Mother   . Cancer Mother        Lung & colon  . Hypertension Mother   . Diabetes Maternal Aunt   . Arthritis Maternal Grandmother   . Hypertension Maternal Grandmother   . Heart disease Maternal Grandmother   . Diabetes  Maternal Grandmother   . Cancer Paternal Grandfather     Review of Systems  Constitutional: Negative for chills and fever.  Eyes: Positive for visual disturbance (blurry vision intermittently - close up only).  Gastrointestinal: Positive for constipation and nausea. Negative for abdominal pain.  Endocrine: Negative for polydipsia and polyuria.  Genitourinary: Negative for dysuria, frequency and hematuria.       Her urine has a strong odor and a powder appearance.  Her urine is chronically dark  Musculoskeletal: Positive for back pain.  Neurological: Positive for headaches. Negative for light-headedness.       Objective:   Vitals:   04/29/17 0755  BP: 124/88  Pulse: 72  Resp: 16  Temp: 98.5 F (36.9 C)  SpO2: 98%   Wt Readings from Last 3 Encounters:  04/29/17 161 lb (73 kg)  08/03/16 155 lb (70.3 kg)  05/11/16 151 lb (68.5 kg)   Body mass index is 28.52 kg/m.   Physical Exam  Constitutional: She appears well-developed and well-nourished. No distress.  HENT:  Head: Normocephalic and atraumatic.  Abdominal: Soft. She exhibits no distension. There is no tenderness (bladder feels full). There is no rebound and no guarding.  Musculoskeletal:  Tenderness in middle back and lower back with palpation  Skin: Skin is warm and dry. She is not diaphoretic.           Assessment & Plan:    See Problem List for Assessment and Plan of chronic medical problems.

## 2017-04-29 ENCOUNTER — Encounter: Payer: Self-pay | Admitting: Internal Medicine

## 2017-04-29 ENCOUNTER — Other Ambulatory Visit (INDEPENDENT_AMBULATORY_CARE_PROVIDER_SITE_OTHER): Payer: 59

## 2017-04-29 ENCOUNTER — Ambulatory Visit (INDEPENDENT_AMBULATORY_CARE_PROVIDER_SITE_OTHER): Payer: 59 | Admitting: Internal Medicine

## 2017-04-29 VITALS — BP 124/88 | HR 72 | Temp 98.5°F | Resp 16 | Wt 161.0 lb

## 2017-04-29 DIAGNOSIS — R8290 Unspecified abnormal findings in urine: Secondary | ICD-10-CM

## 2017-04-29 DIAGNOSIS — Z9189 Other specified personal risk factors, not elsewhere classified: Secondary | ICD-10-CM

## 2017-04-29 LAB — COMPREHENSIVE METABOLIC PANEL
ALT: 14 U/L (ref 0–35)
AST: 16 U/L (ref 0–37)
Albumin: 4.3 g/dL (ref 3.5–5.2)
Alkaline Phosphatase: 91 U/L (ref 39–117)
BUN: 11 mg/dL (ref 6–23)
CHLORIDE: 102 meq/L (ref 96–112)
CO2: 27 meq/L (ref 19–32)
Calcium: 9 mg/dL (ref 8.4–10.5)
Creatinine, Ser: 0.73 mg/dL (ref 0.40–1.20)
GFR: 110.5 mL/min (ref >60.00)
GLUCOSE: 95 mg/dL (ref 70–99)
POTASSIUM: 3.8 meq/L (ref 3.5–5.1)
SODIUM: 138 meq/L (ref 135–145)
TOTAL PROTEIN: 8 g/dL (ref 6.0–8.3)
Total Bilirubin: 0.7 mg/dL (ref 0.2–1.2)

## 2017-04-29 LAB — POCT URINALYSIS DIPSTICK
Bilirubin, UA: NEGATIVE
Blood, UA: NEGATIVE
GLUCOSE UA: NEGATIVE
KETONES UA: NEGATIVE
Leukocytes, UA: NEGATIVE
Nitrite, UA: NEGATIVE
Protein, UA: NEGATIVE
Spec Grav, UA: 1.015 (ref 1.010–1.025)
Urobilinogen, UA: 0.2 E.U./dL
pH, UA: 6 (ref 5.0–8.0)

## 2017-04-29 LAB — HEMOGLOBIN A1C: HEMOGLOBIN A1C: 5.6 % (ref 4.6–6.5)

## 2017-04-29 NOTE — Assessment & Plan Note (Signed)
Urine dip does not show an infection Will send for culture Continue increased water intake

## 2017-04-29 NOTE — Assessment & Plan Note (Signed)
Doubt her urine symptoms are related to diabetes She is at increased risk of diabetes Check a1c Low sugar / carb diet Stressed regular exercise, weight loss

## 2017-04-29 NOTE — Patient Instructions (Addendum)
  Test(s) ordered today. Your results will be released to MyChart (or called to you) after review, usually within 72hours after test completion. If any changes need to be made, you will be notified at that same time.   Medications reviewed and updated.   No changes recommended at this time.    Increase your water intake.  Start exercising regularly.  Decrease your sugar and carbohydrate intake.    Follow up once a year for a physical exam.

## 2017-05-01 LAB — URINE CULTURE
MICRO NUMBER: 90102293
SPECIMEN QUALITY: ADEQUATE

## 2017-05-03 ENCOUNTER — Other Ambulatory Visit: Payer: Self-pay | Admitting: Emergency Medicine

## 2017-05-03 MED ORDER — NITROFURANTOIN MONOHYD MACRO 100 MG PO CAPS
100.0000 mg | ORAL_CAPSULE | Freq: Two times a day (BID) | ORAL | 0 refills | Status: DC
Start: 2017-05-03 — End: 2017-07-26

## 2017-06-07 ENCOUNTER — Telehealth: Payer: Self-pay | Admitting: Internal Medicine

## 2017-06-07 NOTE — Telephone Encounter (Signed)
Copied from CRM [phone]. Topic: Quick Communication - See Telephone Encounter >> Jun 07, 2017  1:26 PM Raquel SarnaHayes, Teresa G wrote: Pt has latex allergy.  Pt is asking what kind of gloves her office will need to order for her to use. Please call pt back asap.

## 2017-06-07 NOTE — Telephone Encounter (Signed)
Spoke with pt to inform that her job should just order latex free gloves.

## 2017-06-29 ENCOUNTER — Telehealth: Payer: Self-pay | Admitting: Internal Medicine

## 2017-06-29 MED ORDER — SULFAMETHOXAZOLE-TRIMETHOPRIM 800-160 MG PO TABS: 1.0000 | ORAL_TABLET | Freq: Two times a day (BID) | ORAL | 0 refills | Status: DC

## 2017-06-29 NOTE — Telephone Encounter (Signed)
Copied from CRM [phone]#[id number]. Topic: Quick Communication - See Telephone Encounter >> Jun 29, 2017  8:33 AM Guinevere FerrariMorris, Ritha Sampedro E, NT wrote: CRM for notification. See Telephone encounter for: 06/29/17. Patient called and said she has a bladder infection and wanted to see if the doctor could send something to [id number]- [place], [place] - [address] [phone] (Phone) [phone] (Fax)

## 2017-06-29 NOTE — Telephone Encounter (Signed)
rx sent to [facility]

## 2017-06-29 NOTE — Telephone Encounter (Signed)
LVM informing pt RX had been sent in.

## 2017-07-08 ENCOUNTER — Other Ambulatory Visit: Payer: Self-pay | Admitting: Allergy and Immunology

## 2017-07-08 DIAGNOSIS — J3089 Other allergic rhinitis: Secondary | ICD-10-CM

## 2017-07-08 DIAGNOSIS — L500 Allergic urticaria: Secondary | ICD-10-CM

## 2017-07-08 NOTE — Telephone Encounter (Signed)
Left message to return call. She needs to get xyzal over the counter until office visit

## 2017-07-08 NOTE — Telephone Encounter (Signed)
Patient is calling to see if she can refill on her xyzal.

## 2017-07-13 ENCOUNTER — Other Ambulatory Visit: Payer: Self-pay | Admitting: Allergy and Immunology

## 2017-07-13 DIAGNOSIS — T7800XA Anaphylactic reaction due to unspecified food, initial encounter: Secondary | ICD-10-CM

## 2017-07-14 ENCOUNTER — Telehealth: Payer: Self-pay

## 2017-07-14 DIAGNOSIS — J3089 Other allergic rhinitis: Secondary | ICD-10-CM

## 2017-07-14 DIAGNOSIS — L500 Allergic urticaria: Secondary | ICD-10-CM

## 2017-07-14 MED ORDER — LEVOCETIRIZINE DIHYDROCHLORIDE 5 MG PO TABS: 5.0000 mg | ORAL_TABLET | Freq: Every evening | ORAL | 0 refills | Status: DC

## 2017-07-14 NOTE — Telephone Encounter (Signed)
Spoke to pt. To let her know that we filled her epipen 0.3mg  and I also sent in levocetirizine 5mg . I told her she had to make a yearly appointment, but pt. Already stated she already has one scheduled for 06/25/2017 at 11:15 am.

## 2017-07-26 ENCOUNTER — Ambulatory Visit (INDEPENDENT_AMBULATORY_CARE_PROVIDER_SITE_OTHER): Payer: 59 | Admitting: Allergy and Immunology

## 2017-07-26 ENCOUNTER — Encounter: Payer: Self-pay | Admitting: Allergy and Immunology

## 2017-07-26 VITALS — BP 118/80 | HR 76 | Resp 16

## 2017-07-26 DIAGNOSIS — L500 Allergic urticaria: Secondary | ICD-10-CM

## 2017-07-26 DIAGNOSIS — T7800XD Anaphylactic reaction due to unspecified food, subsequent encounter: Secondary | ICD-10-CM

## 2017-07-26 DIAGNOSIS — J3089 Other allergic rhinitis: Secondary | ICD-10-CM | POA: Diagnosis not present

## 2017-07-26 DIAGNOSIS — H1010 Acute atopic conjunctivitis, unspecified eye: Secondary | ICD-10-CM | POA: Insufficient documentation

## 2017-07-26 DIAGNOSIS — H1013 Acute atopic conjunctivitis, bilateral: Secondary | ICD-10-CM

## 2017-07-26 MED ORDER — EPINEPHRINE 0.3 MG/0.3ML IJ SOAJ: 0.3000 mg | Freq: Once | INTRAMUSCULAR | 1 refills | Status: AC

## 2017-07-26 MED ORDER — OLOPATADINE HCL 0.7 % OP SOLN: 1.0000 [drp] | OPHTHALMIC | 5 refills | Status: DC

## 2017-07-26 MED ORDER — FLUTICASONE PROPIONATE 50 MCG/ACT NA SUSP: 2.0000 | Freq: Every day | NASAL | 5 refills | Status: DC | PRN

## 2017-07-26 MED ORDER — LEVOCETIRIZINE DIHYDROCHLORIDE 5 MG PO TABS: 5.0000 mg | ORAL_TABLET | Freq: Every evening | ORAL | 0 refills | Status: DC

## 2017-07-26 NOTE — Assessment & Plan Note (Addendum)
Unclear etiology.  Her history suggest the possibility of allergic urticaria secondary to pollen and/or dog dander exposure.  However, to be thorough we will rule out other potential etiologies with labs. For symptom relief, patient is to take oral antihistamines as directed.  The following labs have been ordered: FCeRI antibody, anti-thyroglobulin antibody, thyroid peroxidase antibody, tryptase, CBC, CMP, ESR, ANA, and galactose-alpha-1,3-galactose IgE level.  The patient will be called with further recommendations after lab results have returned.  Continue H1/H2 receptor blockade, titrating to the lowest effective dose necessary to suppress urticaria.  A prescription has been provided for levocetirizine, 5 mg daily as needed.  A prescription has been provided for montelukast 10 mg daily at bedtime.  Should there be a significant increase or change in symptoms, a journal is to be kept recording any foods eaten, beverages consumed, medications taken within a 6 hour period prior to the onset of symptoms, as well as record activities being performed, and environmental conditions. For any symptoms concerning for anaphylaxis, epinephrine is to be administered and 911 is to be called immediately.

## 2017-07-26 NOTE — Patient Instructions (Signed)
Recurrent urticaria Unclear etiology.  Her history suggest the possibility of allergic urticaria secondary to pollen and/or dog dander exposure.  However, to be thorough we will rule out other potential etiologies with labs. For symptom relief, patient is to take oral antihistamines as directed.  The following labs have been ordered: FCeRI antibody, anti-thyroglobulin antibody, thyroid peroxidase antibody, tryptase, CBC, CMP, ESR, ANA, and galactose-alpha-1,3-galactose IgE level.  The patient will be called with further recommendations after lab results have returned.  Continue H1/H2 receptor blockade, titrating to the lowest effective dose necessary to suppress urticaria.  A prescription has been provided for levocetirizine, 5 mg daily as needed.  A prescription has been provided for montelukast 10 mg daily at bedtime.  Should there be a significant increase or change in symptoms, a journal is to be kept recording any foods eaten, beverages consumed, medications taken within a 6 hour period prior to the onset of symptoms, as well as record activities being performed, and environmental conditions. For any symptoms concerning for anaphylaxis, epinephrine is to be administered and 911 is to be called immediately.  Perennial and seasonal allergic rhinitis  Continue appropriate allergen avoidance measures.  Levocetirizine 5 mg daily as needed.  Montelukast 10 mg daily.  Fluticasone nasal spray, 2 sprays per nostril daily as needed.  Nasal saline spray (i.e., Simply Saline) or nasal saline lavage (i.e., NeilMed) is recommended as needed and prior to medicated nasal sprays.  If allergen avoidance measures and medications fail to adequately relieve symptoms, aeroallergen immunotherapy will be considered.  Allergic conjunctivitis  Treatment plan as outlined above for allergic rhinitis.  A prescription has been provided for Pazeo, one drop per eye daily as needed.   When lab results have  returned the patient will be called with further recommendations and follow up instructions.   Urticaria (Hives)  . Levocetirizine (Xyzal) 5 mg twice a day and ranitidine (Zantac) 150 mg twice a day. If no symptoms for 7-14 days then decrease to. . Levocetirizine (Xyzal) 5 mg twice a day and ranitidine (Zantac) 150 mg once a day.  If no symptoms for 7-14 days then decrease to. . Levocetirizine (Xyzal) 5 mg twice a day.  If no symptoms for 7-14 days then decrease to. . Levocetirizine (Xyzal) 5 mg once a day.  May use Benadryl (diphenhydramine) as needed for breakthrough symptoms       If symptoms return, then step up dosage

## 2017-07-26 NOTE — Progress Notes (Signed)
Follow-up Note  RE: Kathleen Ortiz MRN: [id number] DOB: 1971-03-04 Date of Office Visit: 07/26/2017  Primary care provider: Binnie Rail, MD Referring provider: Binnie Rail, MD  History of present illness: Kathleen Ortiz is a 47 y.o. female with allergic rhinitis, food allergy, and recurrent urticaria presenting today for follow-up.  She was previously seen in this clinic for her initial evaluation February 2018.  She reports that her hives had resolved until approximately 2 months ago when they reappeared and have been recurring since that time.  She takes levocetirizine which provides temporary relief.  The hives seem to be worse with pollen exposure.  In addition, she is unsure but believes that the hives may be triggered by exposure to her dog as well.  She reports that she has been experiencing nasal congestion, rhinorrhea, sneezing, nasal pruritus, and ocular pruritus.  The symptoms are worse with pollen exposure.  Assessment and plan: Recurrent urticaria Unclear etiology.  Her history suggest the possibility of allergic urticaria secondary to pollen and/or dog dander exposure.  However, to be thorough we will rule out other potential etiologies with labs. For symptom relief, patient is to take oral antihistamines as directed.  The following labs have been ordered: FCeRI antibody, anti-thyroglobulin antibody, thyroid peroxidase antibody, tryptase, CBC, CMP, ESR, ANA, and galactose-alpha-1,3-galactose IgE level.  The patient will be called with further recommendations after lab results have returned.  Continue H1/H2 receptor blockade, titrating to the lowest effective dose necessary to suppress urticaria.  A prescription has been provided for levocetirizine, 5 mg daily as needed.  A prescription has been provided for montelukast 10 mg daily at bedtime.  Should there be a significant increase or change in symptoms, a journal is to be kept recording any foods eaten,  beverages consumed, medications taken within a 6 hour period prior to the onset of symptoms, as well as record activities being performed, and environmental conditions. For any symptoms concerning for anaphylaxis, epinephrine is to be administered and 911 is to be called immediately.  Perennial and seasonal allergic rhinitis  Continue appropriate allergen avoidance measures.  Levocetirizine 5 mg daily as needed.  Montelukast 10 mg daily.  Fluticasone nasal spray, 2 sprays per nostril daily as needed.  Nasal saline spray (i.e., Simply Saline) or nasal saline lavage (i.e., NeilMed) is recommended as needed and prior to medicated nasal sprays.  If allergen avoidance measures and medications fail to adequately relieve symptoms, aeroallergen immunotherapy will be considered.  Allergic conjunctivitis  Treatment plan as outlined above for allergic rhinitis.  A prescription has been provided for Pazeo, one drop per eye daily as needed.   Meds ordered this encounter  Medications  . EPINEPHrine (AUVI-Q) 0.3 mg/0.3 mL IJ SOAJ injection    Sig: Inject 0.3 mLs (0.3 mg total) into the muscle once for 1 dose.    Dispense:  2 Device    Refill:  1  . Olopatadine HCl (PAZEO) 0.7 % SOLN    Sig: Place 1 drop into both eyes 1 day or 1 dose.    Dispense:  1 Bottle    Refill:  5    Diagnostics: Labs have been ordered.    Physical examination: Blood pressure 118/80, pulse 76, resp. rate 16.  General: Alert, interactive, in no acute distress. HEENT: TMs pearly gray, turbinates moderately edematous with clear discharge, post-pharynx moderately erythematous. Neck: Supple without lymphadenopathy. Lungs: Clear to auscultation without wheezing, rhonchi or rales. CV: Normal S1, S2 without murmurs. Skin: Warm and  dry, without lesions or rashes.  The following portions of the patient's history were reviewed and updated as appropriate: allergies, current medications, past family history, past medical  history, past social history, past surgical history and problem list.  Allergies as of 07/26/2017      Reactions   Shellfish Allergy    Latex Rash      Medication List        Accurate as of 07/26/17  8:20 PM. Always use your most recent med list.          EPINEPHrine 0.3 mg/0.3 mL Soaj injection Commonly known as:  AUVI-Q Inject 0.3 mLs (0.3 mg total) into the muscle once for 1 dose.   fluticasone 50 MCG/ACT nasal spray Commonly known as:  FLONASE Place 2 sprays into both nostrils daily as needed for allergies or rhinitis.   levocetirizine 5 MG tablet Commonly known as:  XYZAL Take 1 tablet (5 mg total) by mouth every evening.   montelukast 10 MG tablet Commonly known as:  SINGULAIR Take 1 tablet (10 mg total) by mouth at bedtime.   Olopatadine HCl 0.7 % Soln Commonly known as:  PAZEO Place 1 drop into both eyes 1 day or 1 dose.   omeprazole 40 MG capsule Commonly known as:  PRILOSEC Take 40 mg by mouth daily.   sertraline 25 MG tablet Commonly known as:  ZOLOFT Take 25 mg by mouth daily.   traMADol 50 MG tablet Commonly known as:  ULTRAM Take 50 mg by mouth every 6 (six) hours as needed.   Vitamin D (Cholecalciferol) 1000 units Caps Take 2,000 Units by mouth 3 (three) times a week.       Allergies  Allergen Reactions  . Shellfish Allergy   . Latex Rash   Review of systems: Review of systems negative except as noted in HPI / PMHx or noted below: Constitutional: Negative.  HENT: Negative.   Eyes: Negative.  Respiratory: Negative.   Cardiovascular: Negative.  Gastrointestinal: Negative.  Genitourinary: Negative.  Musculoskeletal: Negative.  Neurological: Negative.  Endo/Heme/Allergies: Negative.  Cutaneous: Negative.  Past Medical History:  Diagnosis Date  . Chronic bronchitis ([facility])   . Eczema   . GERD (gastroesophageal reflux disease)   . Urticaria     Family History  Problem Relation Age of Onset  . Arthritis Mother   . Cancer Mother          Lung & colon  . Hypertension Mother   . Diabetes Maternal Aunt   . Arthritis Maternal Grandmother   . Hypertension Maternal Grandmother   . Heart disease Maternal Grandmother   . Diabetes Maternal Grandmother   . Cancer Paternal Grandfather     Social History   Socioeconomic History  . Marital status: Single    Spouse name: Not on file  . Number of children: Not on file  . Years of education: Not on file  . Highest education level: Not on file  Occupational History  . Not on file  Social Needs  . Financial resource strain: Not on file  . Food insecurity:    Worry: Not on file    Inability: Not on file  . Transportation needs:    Medical: Not on file    Non-medical: Not on file  Tobacco Use  . Smoking status: Former Smoker    Packs/day: 0.10    Types: Cigarettes  . Smokeless tobacco: Never Used  Substance and Sexual Activity  . Alcohol use: Yes  . Drug use: No  .  Sexual activity: Not on file  Lifestyle  . Physical activity:    Days per week: Not on file    Minutes per session: Not on file  . Stress: Not on file  Relationships  . Social connections:    Talks on phone: Not on file    Gets together: Not on file    Attends religious service: Not on file    Active member of club or organization: Not on file    Attends meetings of clubs or organizations: Not on file    Relationship status: Not on file  . Intimate partner violence:    Fear of current or ex partner: Not on file    Emotionally abused: Not on file    Physically abused: Not on file    Forced sexual activity: Not on file  Other Topics Concern  . Not on file  Social History Narrative   No regular exercise       I appreciate the opportunity to take part in [place] care. Please do not hesitate to contact me with questions.  Sincerely,   R. Edgar Frisk, MD

## 2017-07-26 NOTE — Assessment & Plan Note (Signed)
   Treatment plan as outlined above for allergic rhinitis.  A prescription has been provided for Pazeo, one drop per eye daily as needed. 

## 2017-07-26 NOTE — Assessment & Plan Note (Addendum)
   Continue appropriate allergen avoidance measures.  Levocetirizine 5 mg daily as needed.  Montelukast 10 mg daily.  Fluticasone nasal spray, 2 sprays per nostril daily as needed.  Nasal saline spray (i.e., Simply Saline) or nasal saline lavage (i.e., NeilMed) is recommended as needed and prior to medicated nasal sprays.  If allergen avoidance measures and medications fail to adequately relieve symptoms, aeroallergen immunotherapy will be considered.

## 2017-07-27 ENCOUNTER — Other Ambulatory Visit: Payer: Self-pay

## 2017-07-27 DIAGNOSIS — J3089 Other allergic rhinitis: Secondary | ICD-10-CM

## 2017-07-27 MED ORDER — OLOPATADINE HCL 0.7 % OP SOLN: 1.0000 [drp] | OPHTHALMIC | 5 refills | Status: DC

## 2017-07-27 MED ORDER — FLUTICASONE PROPIONATE 50 MCG/ACT NA SUSP: 2.0000 | Freq: Every day | NASAL | 5 refills | Status: DC | PRN

## 2017-08-01 LAB — ALPHA-GAL PANEL
Alpha Gal IgE*: <0.1 kU/L (ref <0.10)
Beef (Bos spp) IgE: 2.99 kU/L — ABNORMAL HIGH (ref <0.35)
Class Interpretation: 2
Lamb/Mutton (Ovis spp) IgE: 0.26 kU/L (ref <0.35)
Pork (Sus spp) IgE: 0.1 kU/L (ref <0.35)

## 2017-08-01 LAB — COMPREHENSIVE METABOLIC PANEL
ALT: 18 IU/L (ref 0–32)
AST: 17 IU/L (ref 0–40)
Albumin/Globulin Ratio: 1.4 (ref 1.2–2.2)
Albumin: 4.4 g/dL (ref 3.5–5.5)
Alkaline Phosphatase: 109 IU/L (ref 39–117)
BUN/Creatinine Ratio: 15 (ref 9–23)
BUN: 11 mg/dL (ref 6–24)
Bilirubin Total: 0.3 mg/dL (ref 0.0–1.2)
CO2: 23 mmol/L (ref 20–29)
Calcium: 9 mg/dL (ref 8.7–10.2)
Chloride: 104 mmol/L (ref 96–106)
Creatinine, Ser: 0.72 mg/dL (ref 0.57–1.00)
GFR calc Af Amer: 117 mL/min/{1.73_m2} (ref >59)
GFR calc non Af Amer: 101 mL/min/{1.73_m2} (ref >59)
Globulin, Total: 3.2 g/dL (ref 1.5–4.5)
Glucose: 80 mg/dL (ref 65–99)
Potassium: 4.2 mmol/L (ref 3.5–5.2)
Sodium: 140 mmol/L (ref 134–144)
Total Protein: 7.6 g/dL (ref 6.0–8.5)

## 2017-08-01 LAB — THYROID PEROXIDASE ANTIBODY: Thyroperoxidase Ab SerPl-aCnc: 22 IU/mL (ref 0–34)

## 2017-08-01 LAB — CBC WITH DIFFERENTIAL/PLATELET
Basophils Absolute: 0 10*3/uL (ref 0.0–0.2)
Basos: 0 %
EOS (ABSOLUTE): 0.1 10*3/uL (ref 0.0–0.4)
Eos: 3 %
Hematocrit: 35.6 % (ref 34.0–46.6)
Hemoglobin: 11.9 g/dL (ref 11.1–15.9)
Immature Grans (Abs): 0 10*3/uL (ref 0.0–0.1)
Immature Granulocytes: 0 %
Lymphocytes Absolute: 1.7 10*3/uL (ref 0.7–3.1)
Lymphs: 42 %
MCH: 29.4 pg (ref 26.6–33.0)
MCHC: 33.4 g/dL (ref 31.5–35.7)
MCV: 88 fL (ref 79–97)
Monocytes Absolute: 0.3 10*3/uL (ref 0.1–0.9)
Monocytes: 7 %
Neutrophils Absolute: 1.9 10*3/uL (ref 1.4–7.0)
Neutrophils: 48 %
Platelets: 235 10*3/uL (ref 150–379)
RBC: 4.05 x10E6/uL (ref 3.77–5.28)
RDW: 13.5 % (ref 12.3–15.4)
WBC: 4.1 10*3/uL (ref 3.4–10.8)

## 2017-08-01 LAB — SEDIMENTATION RATE: Sed Rate: 25 mm/hr (ref 0–32)

## 2017-08-01 LAB — CHRONIC URTICARIA: cu index: <2.1 (ref <10)

## 2017-08-01 LAB — THYROGLOBULIN LEVEL: Thyroglobulin (TG-RIA): 14 ng/mL

## 2017-08-01 LAB — TRYPTASE: Tryptase: 4.1 ug/L (ref 2.2–13.2)

## 2017-08-01 LAB — ANA W/REFLEX IF POSITIVE: Anti Nuclear Antibody(ANA): NEGATIVE

## 2017-08-09 ENCOUNTER — Telehealth: Payer: Self-pay

## 2017-08-09 NOTE — Telephone Encounter (Signed)
Lab results discussed with patient. Verbalizes understanding.

## 2017-08-14 ENCOUNTER — Other Ambulatory Visit: Payer: Self-pay | Admitting: Allergy and Immunology

## 2017-08-23 ENCOUNTER — Encounter: Payer: Self-pay | Admitting: Allergy and Immunology

## 2017-08-23 ENCOUNTER — Ambulatory Visit (INDEPENDENT_AMBULATORY_CARE_PROVIDER_SITE_OTHER): Payer: 59 | Admitting: Allergy and Immunology

## 2017-08-23 DIAGNOSIS — L500 Allergic urticaria: Secondary | ICD-10-CM

## 2017-08-23 DIAGNOSIS — J3089 Other allergic rhinitis: Secondary | ICD-10-CM | POA: Diagnosis not present

## 2017-08-23 DIAGNOSIS — T7800XD Anaphylactic reaction due to unspecified food, subsequent encounter: Secondary | ICD-10-CM | POA: Diagnosis not present

## 2017-08-23 NOTE — Progress Notes (Signed)
New Patient Note  RE: [place] GAMERO MRN: [id number] DOB: February 21, 1971 Date of Office Visit: 08/23/2017  Referring provider: Pincus Sanes, MD Primary care provider: Pincus Sanes, MD  Chief Complaint: Allergy Testing   History of present illness: Kathleen Ortiz is a 47 y.o. female with allergic rhinoconjunctivitis, food allergy, and allergic urticaria presenting today for follow-up.  She reports that her urticaria has continued to recur with pollen exposure.  She reports today that since 1997 she has experienced recurrent episodes of urticaria which only occurred during the pollen season.  She does not experience urticaria during the wintertime.  She has been taking levocetirizine with some benefit, however it has not been completely suppressing the urticaria and pruritus.  She admits that she had forgotten to do the H1/H2 receptor blockade as recommended.  She attempts to control her nasal allergy symptoms with levocetirizine and fluticasone nasal spray.  Assessment and plan: Recurrent urticaria  Continue levocetirizine as directed and montelukast 10 mg daily bedtime.    Add ranitidine as directed.  Instructions have been discussed and provided for H1/H2 receptor blockade with titration to find lowest effective dose.  Should symptoms recur, a journal is to be kept recording any foods eaten, beverages consumed, and medications taken within a 6 hour time period prior to the onset of symptoms, as well as record activities being performed, and environmental conditions. For any symptoms concerning for anaphylaxis, epinephrine is to be administered and 911 is to be called immediately.  Perennial and seasonal allergic rhinitis  Continue appropriate allergen avoidance measures, levocetirizine as directed, montelukast 10 mg daily bedtime, and fluticasone nasal spray as needed.  Nasal saline spray (i.e., Simply Saline) or nasal saline lavage (i.e., NeilMed) is recommended as needed and  prior to medicated nasal sprays.  If allergen avoidance measures and medications fail to adequately relieve symptoms, aeroallergen immunotherapy will be considered.  Food allergy  Continue careful avoidance of shellfish and beef and have access to epinephrine autoinjector 2 pack in case of accidental ingestion.   No orders of the defined types were placed in this encounter.   Diagnostics: None.    Physical examination: Blood pressure 122/70, pulse 79, resp. rate 17, height  (1.575 m), weight 165 lb 6.4 oz (75 kg), SpO2 97 %.  General: Alert, interactive, in no acute distress. HEENT: TMs pearly gray, turbinates mildly edematous without discharge, post-pharynx mildly erythematous. Neck: Supple without lymphadenopathy. Lungs: Clear to auscultation without wheezing, rhonchi or rales. CV: Normal S1, S2 without murmurs. Abdomen: Nondistended, nontender. Skin: Warm and dry, without lesions or rashes. Extremities:  No clubbing, cyanosis or edema. Neuro:   Grossly intact.  Past medical history:  Past Medical History:  Diagnosis Date  . Chronic bronchitis (HCC)   . Eczema   . GERD (gastroesophageal reflux disease)   . Urticaria     Past surgical history:  Past Surgical History:  Procedure Laterality Date  . TYMPANOSTOMY TUBE PLACEMENT      Family history: Family History  Problem Relation Age of Onset  . Arthritis Mother   . Cancer Mother        Lung & colon  . Hypertension Mother   . Diabetes Maternal Aunt   . Arthritis Maternal Grandmother   . Hypertension Maternal Grandmother   . Heart disease Maternal Grandmother   . Diabetes Maternal Grandmother   . Cancer Paternal Grandfather     Social history: Social History   Socioeconomic History  . Marital status: Single  Spouse name: Not on file  . Number of children: Not on file  . Years of education: Not on file  . Highest education level: Not on file  Occupational History  . Not on file  Social Needs    . Financial resource strain: Not on file  . Food insecurity:    Worry: Not on file    Inability: Not on file  . Transportation needs:    Medical: Not on file    Non-medical: Not on file  Tobacco Use  . Smoking status: Former Smoker    Packs/day: 0.10    Types: Cigarettes  . Smokeless tobacco: Never Used  Substance and Sexual Activity  . Alcohol use: Yes  . Drug use: No  . Sexual activity: Not on file  Lifestyle  . Physical activity:    Days per week: Not on file    Minutes per session: Not on file  . Stress: Not on file  Relationships  . Social connections:    Talks on phone: Not on file    Gets together: Not on file    Attends religious service: Not on file    Active member of club or organization: Not on file    Attends meetings of clubs or organizations: Not on file    Relationship status: Not on file  . Intimate partner violence:    Fear of current or ex partner: Not on file    Emotionally abused: Not on file    Physically abused: Not on file    Forced sexual activity: Not on file  Other Topics Concern  . Not on file  Social History Narrative   No regular exercise       Allergies as of 08/23/2017      Reactions   Shellfish Allergy    Latex Rash      Medication List        Accurate as of 08/23/17  2:43 PM. Always use your most recent med list.          fluticasone 50 MCG/ACT nasal spray Commonly known as:  FLONASE Place 2 sprays into both nostrils daily as needed for allergies or rhinitis.   levocetirizine 5 MG tablet Commonly known as:  XYZAL TAKE 1 TABLET BY MOUTH EVERY DAY IN THE EVENING   Olopatadine HCl 0.7 % Soln Commonly known as:  PAZEO Place 1 drop into both eyes 1 day or 1 dose.   omeprazole 40 MG capsule Commonly known as:  PRILOSEC Take 40 mg by mouth daily.   sertraline 25 MG tablet Commonly known as:  ZOLOFT Take 25 mg by mouth daily.   Vitamin D (Cholecalciferol) 1000 units Caps Take 2,000 Units by mouth 3 (three) times a  week.       Known medication allergies: Allergies  Allergen Reactions  . Shellfish Allergy   . Latex Rash    I appreciate the opportunity to take part in Kriss's care. Please do not hesitate to contact me with questions.  Sincerely,   R. Jorene Guest, MD

## 2017-08-23 NOTE — Patient Instructions (Addendum)
Recurrent urticaria  Continue levocetirizine as directed and montelukast 10 mg daily bedtime.    Add ranitidine as directed.  Instructions have been discussed and provided for H1/H2 receptor blockade with titration to find lowest effective dose.  Should symptoms recur, a journal is to be kept recording any foods eaten, beverages consumed, and medications taken within a 6 hour time period prior to the onset of symptoms, as well as record activities being performed, and environmental conditions. For any symptoms concerning for anaphylaxis, epinephrine is to be administered and 911 is to be called immediately.  Perennial and seasonal allergic rhinitis  Continue appropriate allergen avoidance measures, levocetirizine as directed, montelukast 10 mg daily bedtime, and fluticasone nasal spray as needed.  Nasal saline spray (i.e., Simply Saline) or nasal saline lavage (i.e., NeilMed) is recommended as needed and prior to medicated nasal sprays.  If allergen avoidance measures and medications fail to adequately relieve symptoms, aeroallergen immunotherapy will be considered.  Food allergy  Continue careful avoidance of shellfish and beef and have access to epinephrine autoinjector 2 pack in case of accidental ingestion.   Return in about 6-12 months, or if symptoms worsen or fail to improve.  Urticaria (Hives)  . Levocetirizine (Xyzal) 5 mg twice a day and ranitidine (Zantac) 150 mg twice a day. If no symptoms for 7-14 days then decrease to. . Levocetirizine (Xyzal) 5 mg twice a day and ranitidine (Zantac) 150 mg once a day.  If no symptoms for 7-14 days then decrease to. . Levocetirizine (Xyzal) 5 mg twice a day.  If no symptoms for 7-14 days then decrease to. . Levocetirizine (Xyzal) 5 mg once a day.  May use Benadryl (diphenhydramine) as needed for breakthrough symptoms       If symptoms return, then step up dosage

## 2017-08-23 NOTE — Assessment & Plan Note (Signed)
   Continue careful avoidance of shellfish and beef and have access to epinephrine autoinjector 2 pack in case of accidental ingestion. 

## 2017-08-23 NOTE — Assessment & Plan Note (Signed)
   Continue levocetirizine as directed and montelukast 10 mg daily bedtime.    Add ranitidine as directed.  Instructions have been discussed and provided for H1/H2 receptor blockade with titration to find lowest effective dose.  Should symptoms recur, a journal is to be kept recording any foods eaten, beverages consumed, and medications taken within a 6 hour time period prior to the onset of symptoms, as well as record activities being performed, and environmental conditions. For any symptoms concerning for anaphylaxis, epinephrine is to be administered and 911 is to be called immediately.

## 2017-08-23 NOTE — Assessment & Plan Note (Signed)
   Continue appropriate allergen avoidance measures, levocetirizine as directed, montelukast 10 mg daily bedtime, and fluticasone nasal spray as needed.  Nasal saline spray (i.e., Simply Saline) or nasal saline lavage (i.e., NeilMed) is recommended as needed and prior to medicated nasal sprays.  If allergen avoidance measures and medications fail to adequately relieve symptoms, aeroallergen immunotherapy will be considered.

## 2017-08-27 ENCOUNTER — Telehealth: Payer: Self-pay | Admitting: Allergy and Immunology

## 2017-08-27 NOTE — Telephone Encounter (Signed)
Called and spoke with patient and she informed me that pharmacy said they sent over the PA twice. Will call pharmacy to see if they can send it again.

## 2017-08-27 NOTE — Telephone Encounter (Signed)
Called pharmacy and they are faxing PA over.

## 2017-08-27 NOTE — Telephone Encounter (Signed)
Patient said she went to fill her prescription for eyedrops and was told she needed a prior authorization. [facility] [address]

## 2017-08-31 NOTE — Telephone Encounter (Signed)
Prior Kathleen Ortiz has been submitted via covermymeds. Patient has no documented history of trial/failure of formulary alternatives. Will await approval/denial.

## 2017-09-02 NOTE — Telephone Encounter (Signed)
Olopatadine 0.2%. Thanks.

## 2017-09-02 NOTE — Telephone Encounter (Signed)
Patient's insurance has denied coverage of Pazeo eyedrops. Plan states that she must try and fail olopatadine 0.1%, olopatadine 0.2%, ketotifen eyedrops. Please advise and thank you.

## 2017-09-03 ENCOUNTER — Other Ambulatory Visit: Payer: Self-pay

## 2017-09-03 MED ORDER — OLOPATADINE HCL 0.2 % OP SOLN: 1.0000 [drp] | Freq: Every day | OPHTHALMIC | 5 refills | Status: DC | PRN

## 2017-09-03 NOTE — Telephone Encounter (Signed)
rx sent in 

## 2017-12-10 ENCOUNTER — Encounter: Payer: Self-pay | Admitting: Internal Medicine

## 2017-12-10 ENCOUNTER — Ambulatory Visit (INDEPENDENT_AMBULATORY_CARE_PROVIDER_SITE_OTHER)
Admission: RE | Admit: 2017-12-10 | Discharge: 2017-12-10 | Disposition: A | Discharge status: Home / Self Care | Payer: 59 | Source: Ambulatory Visit | Attending: Internal Medicine | Admitting: Internal Medicine

## 2017-12-10 ENCOUNTER — Ambulatory Visit (INDEPENDENT_AMBULATORY_CARE_PROVIDER_SITE_OTHER): Payer: 59 | Admitting: Internal Medicine

## 2017-12-10 ENCOUNTER — Other Ambulatory Visit (INDEPENDENT_AMBULATORY_CARE_PROVIDER_SITE_OTHER): Payer: 59

## 2017-12-10 VITALS — BP 126/86 | HR 84 | Temp 98.6°F | Resp 16 | Ht 62.0 in | Wt 164.1 lb

## 2017-12-10 DIAGNOSIS — R140 Abdominal distension (gaseous): Secondary | ICD-10-CM

## 2017-12-10 DIAGNOSIS — R1084 Generalized abdominal pain: Secondary | ICD-10-CM | POA: Diagnosis not present

## 2017-12-10 DIAGNOSIS — R109 Unspecified abdominal pain: Secondary | ICD-10-CM

## 2017-12-10 LAB — CBC WITH DIFFERENTIAL/PLATELET
BASOS ABS: 0 10*3/uL (ref 0.0–0.1)
Basophils Relative: 0.6 % (ref 0.0–3.0)
Eosinophils Absolute: 0.2 10*3/uL (ref 0.0–0.7)
Eosinophils Relative: 2.9 % (ref 0.0–5.0)
HCT: 35 % — ABNORMAL LOW (ref 36.0–46.0)
Hemoglobin: 11.9 g/dL — ABNORMAL LOW (ref 12.0–15.0)
LYMPHS ABS: 1.6 10*3/uL (ref 0.7–4.0)
Lymphocytes Relative: 30.9 % (ref 12.0–46.0)
MCHC: 33.8 g/dL (ref 30.0–36.0)
MCV: 87.8 fl (ref 78.0–100.0)
Monocytes Absolute: 0.5 10*3/uL (ref 0.1–1.0)
Monocytes Relative: 8.7 % (ref 3.0–12.0)
NEUTROS ABS: 3 10*3/uL (ref 1.4–7.7)
NEUTROS PCT: 56.9 % (ref 43.0–77.0)
PLATELETS: 211 10*3/uL (ref 150.0–400.0)
RBC: 3.99 Mil/uL (ref 3.87–5.11)
RDW: 13 % (ref 11.5–15.5)
WBC: 5.3 10*3/uL (ref 4.0–10.5)

## 2017-12-10 LAB — COMPREHENSIVE METABOLIC PANEL
ALT: 13 U/L (ref 0–35)
AST: 14 U/L (ref 0–37)
Albumin: 4.2 g/dL (ref 3.5–5.2)
Alkaline Phosphatase: 94 U/L (ref 39–117)
BILIRUBIN TOTAL: 0.3 mg/dL (ref 0.2–1.2)
BUN: 14 mg/dL (ref 6–23)
CO2: 29 mEq/L (ref 19–32)
CREATININE: 0.83 mg/dL (ref 0.40–1.20)
Calcium: 8.9 mg/dL (ref 8.4–10.5)
Chloride: 106 mEq/L (ref 96–112)
GFR: 95.03 mL/min (ref >60.00)
GLUCOSE: 94 mg/dL (ref 70–99)
Potassium: 3.9 mEq/L (ref 3.5–5.1)
SODIUM: 140 meq/L (ref 135–145)
Total Protein: 7.6 g/dL (ref 6.0–8.3)

## 2017-12-10 LAB — LIPASE: Lipase: 22 U/L (ref 11.0–59.0)

## 2017-12-10 LAB — AMYLASE: Amylase: 66 U/L (ref 27–131)

## 2017-12-10 MED ORDER — IOPAMIDOL (ISOVUE-300) INJECTION 61%
100.0000 mL | Freq: Once | INTRAVENOUS | Status: AC | PRN
  Administered 2017-12-10: 100 mL via INTRAVENOUS

## 2017-12-10 MED ORDER — HYOSCYAMINE SULFATE SL 0.125 MG SL SUBL: 0.1250 mg | SUBLINGUAL_TABLET | SUBLINGUAL | 0 refills | Status: DC | PRN

## 2017-12-10 NOTE — Progress Notes (Signed)
Subjective:    Patient ID: Kathleen Ortiz, female    DOB: 1970-08-03, 47 y.o.   MRN: [id number]  HPI The patient is here for an acute visit.  Her symptoms started one week ago.  She passed gas that evening and had a small amount of fecal incontinence.  She still felt ok.  Sunday she felt off.  4 days ago her symptoms really began.  She started having abdominal pain that was diffuse and it feels like a cramping - burning pain. It radiates around to her sides and up into her chest.  She is also having back pain.   She has nausea.  She has not vomitted.  She is feeling heartburn/reflux.  She is taking her omeprazole daily and her GERD is typically well controlled.    Her BMs have been normal. She denies constipation, diarrhea.  4 weeks ago she had blood on the toilet paper after she had a BM.  She did see GI and is doing a fecal occult at home and will send that in.  She denies seeing any other blood.    Her symptoms are worse with eating.  Pain is currently 10/10.    She has taken pepto and it did not help.  Last night her brother had similar symptoms, but they started yesterday and he felt better today.  He had vomiting, diarrhea, abdominal pain.    She did drink alcohol last week - about a 6 pack.  She has been taken aleve pm 2-3 times a week.     Medications and allergies reviewed with patient and updated if appropriate.  Patient Active Problem List   Diagnosis Date Noted  . Allergic conjunctivitis 07/26/2017  . Abnormal urine odor 04/29/2017  . Fatigue 08/03/2016  . Nausea 08/03/2016  . Lightheadedness 08/03/2016  . Gastroesophageal reflux disease 08/03/2016  . Elevated blood pressure reading 08/03/2016  . At risk of diabetes mellitus 08/03/2016  . Recurrent urticaria 05/11/2016  . Food allergy 05/11/2016  . Itching 03/17/2016  . Rash and nonspecific skin eruption 03/17/2016  . Right shoulder pain 08/02/2015  . Perennial and seasonal allergic rhinitis 03/20/2015  .  Vitamin D deficiency 03/20/2015  . Pain of both shoulder joints 03/20/2015  . Eczema 03/20/2015    Current Outpatient Medications on File Prior to Visit  Medication Sig Dispense Refill  . fluticasone (FLONASE) 50 MCG/ACT nasal spray Place 2 sprays into both nostrils daily as needed for allergies or rhinitis. 16 g 5  . levocetirizine (XYZAL) 5 MG tablet TAKE 1 TABLET BY MOUTH EVERY DAY IN THE EVENING 30 tablet 5  . Olopatadine HCl (PAZEO) 0.7 % SOLN Place 1 drop into both eyes 1 day or 1 dose. 1 Bottle 5  . Olopatadine HCl 0.2 % SOLN Apply 1 drop to eye daily as needed. 2.5 mL 5  . omeprazole (PRILOSEC) 40 MG capsule Take 40 mg by mouth daily.    . sertraline (ZOLOFT) 25 MG tablet Take 25 mg by mouth daily.    . Vitamin D, Cholecalciferol, 1000 UNITS CAPS Take 2,000 Units by mouth 3 (three) times a week.     No current facility-administered medications on file prior to visit.     Past Medical History:  Diagnosis Date  . Chronic bronchitis (HCC)   . Eczema   . GERD (gastroesophageal reflux disease)   . Urticaria     Past Surgical History:  Procedure Laterality Date  . TYMPANOSTOMY TUBE PLACEMENT  Social History   Socioeconomic History  . Marital status: Single    Spouse name: Not on file  . Number of children: Not on file  . Years of education: Not on file  . Highest education level: Not on file  Occupational History  . Not on file  Social Needs  . Financial resource strain: Not on file  . Food insecurity:    Worry: Not on file    Inability: Not on file  . Transportation needs:    Medical: Not on file    Non-medical: Not on file  Tobacco Use  . Smoking status: Former Smoker    Packs/day: 0.10    Types: Cigarettes  . Smokeless tobacco: Never Used  Substance and Sexual Activity  . Alcohol use: Yes  . Drug use: No  . Sexual activity: Not on file  Lifestyle  . Physical activity:    Days per week: Not on file    Minutes per session: Not on file  . Stress:  Not on file  Relationships  . Social connections:    Talks on phone: Not on file    Gets together: Not on file    Attends religious service: Not on file    Active member of club or organization: Not on file    Attends meetings of clubs or organizations: Not on file    Relationship status: Not on file  Other Topics Concern  . Not on file  Social History Narrative   No regular exercise       Family History  Problem Relation Age of Onset  . Arthritis Mother   . Cancer Mother        Lung & colon  . Hypertension Mother   . Diabetes Maternal Aunt   . Arthritis Maternal Grandmother   . Hypertension Maternal Grandmother   . Heart disease Maternal Grandmother   . Diabetes Maternal Grandmother   . Cancer Paternal Grandfather     Review of Systems  Constitutional: Positive for chills and fever (subjective).  Respiratory: Negative for cough, shortness of breath and wheezing.   Gastrointestinal: Positive for abdominal distention, abdominal pain and nausea. Negative for blood in stool, constipation, diarrhea and vomiting.  Genitourinary: Negative for dysuria, frequency and hematuria.  Neurological: Positive for headaches.       Objective:   Vitals:   12/10/17 1028  BP: 126/86  Pulse: 84  Resp: 16  Temp: 98.6 F (37 C)  SpO2: 98%   BP Readings from Last 3 Encounters:  12/10/17 126/86  08/23/17 122/70  07/26/17 118/80   Wt Readings from Last 3 Encounters:  12/10/17 164 lb 1.9 oz (74.4 kg)  08/23/17 165 lb 6.4 oz (75 kg)  04/29/17 161 lb (73 kg)   Body mass index is 30.02 kg/m.   Physical Exam  Constitutional: She appears well-developed and well-nourished.  Non-toxic appearance. She does not appear ill. No distress.  HENT:  Head: Normocephalic and atraumatic.  Cardiovascular: Normal rate and regular rhythm.  Pulmonary/Chest: Effort normal and breath sounds normal.  Abdominal: Bowel sounds are normal. There is tenderness (across uppper abdomen worse in epigastric  region and LUQ). There is no rebound, no guarding, no CVA tenderness, no tenderness at McBurney's point and negative Murphy's sign.  Skin: Skin is warm and dry.           Assessment & Plan:    Abdominal cramping, pain and distension with nausea: Pain is severe - she rates it as a 10/10 Unlikely gastroenteritis with  this severe of pain and no diarrhea Concern for GB disease/stone/cholecystitis, pancreatitis or gastritis/ulcer Ct of Abd. Pelvis Blood work today levsin for cramping Further treatment per above results  See Problem List for Assessment and Plan of chronic medical problems.

## 2017-12-10 NOTE — Patient Instructions (Addendum)
Have blood work done today.    A Ct scan was ordered. This will hopefully be done today.     a medication for cramping was sent to your pharmacy.

## 2017-12-11 ENCOUNTER — Telehealth: Payer: Self-pay | Admitting: *Deleted

## 2017-12-11 ENCOUNTER — Encounter: Payer: Self-pay | Admitting: Internal Medicine

## 2017-12-11 DIAGNOSIS — K760 Fatty (change of) liver, not elsewhere classified: Secondary | ICD-10-CM | POA: Insufficient documentation

## 2017-12-11 NOTE — Telephone Encounter (Deleted)
Patients father called and stated that the pharmacy did not receive adderall refill.  He stated he was told that rx was faxed Friday.  Explain to father that we could no refill medication over the weekend and he would have to contact PCP Monday.  Also explained that it was possibility that fax did not go through but I had no way to check.  He was a little frustrated and stated he "did not like the new system"  He mentioned that he would possibly like to start picking up the rx's because now his daughter out of medication till Monday.  I apologized to patient's father for the inconvenience and told him I would send a message to Dr. Burchette's office for review and Rachel Vereen, CMA would contact him Monday morning.  Note forwarded to Rachel for review  

## 2017-12-11 NOTE — Telephone Encounter (Signed)
Pt called stating she never received CT results and she was in pain and she thought she could have gall stones.  Patient told nurse at [facility] that her pain scale was 20 out of 10.  Seen where Dr. Lawerance Bach reviewed and documented on CT results so I informed patient of CT results.  Dr. [place] agreed with Nurse's advice to go to ED to be evaluated for her pain.  She stated she will rest for a while and if pain persisted she would go to ED however, she would like for someone at Dr. Lawerance Bach office to follow up with her on Monday.  I informed patient that I would forward note and someone would call her.  Nothing further needed at this time.

## 2017-12-12 NOTE — Telephone Encounter (Signed)
Call patient - review CT results again.  See how her pain is.

## 2017-12-13 NOTE — Telephone Encounter (Signed)
Pt aware of results 

## 2018-02-08 LAB — HM PAP SMEAR: HM Pap smear: NEGATIVE

## 2018-05-09 DIAGNOSIS — Z1231 Encounter for screening mammogram for malignant neoplasm of breast: Secondary | ICD-10-CM | POA: Diagnosis not present

## 2018-05-09 LAB — HM MAMMOGRAPHY

## 2018-05-19 ENCOUNTER — Encounter: Payer: Self-pay | Admitting: Family Medicine

## 2018-05-19 ENCOUNTER — Ambulatory Visit: Payer: 59 | Admitting: Family Medicine

## 2018-05-19 VITALS — BP 132/78 | HR 85 | Temp 98.7°F | Ht 62.0 in | Wt 176.2 lb

## 2018-05-19 DIAGNOSIS — H66003 Acute suppurative otitis media without spontaneous rupture of ear drum, bilateral: Secondary | ICD-10-CM

## 2018-05-19 DIAGNOSIS — J029 Acute pharyngitis, unspecified: Secondary | ICD-10-CM

## 2018-05-19 MED ORDER — AMOXICILLIN-POT CLAVULANATE 875-125 MG PO TABS: 1.0000 | ORAL_TABLET | Freq: Two times a day (BID) | ORAL | 0 refills | Status: DC

## 2018-05-19 NOTE — Patient Instructions (Addendum)
It was a pleasure to meet you today.  Your strep test was negative today.  Please complete antibiotic with food as directed.  Please drink plenty of water so that your urine is pale yellow or clear. Also, get plenty of rest and follow up if symptoms do not improve in 3 to 4 days, worsen, or you develop a fever >101.   Otitis Media, Adult  Otitis media means that the middle ear is red and swollen (inflamed) and full of fluid. The condition usually goes away on its own. Follow these instructions at home:  Take over-the-counter and prescription medicines only as told by your doctor.  If you were prescribed an antibiotic medicine, take it as told by your doctor. Do not stop taking the antibiotic even if you start to feel better.  Keep all follow-up visits as told by your doctor. This is important. Contact a doctor if:  You have bleeding from your nose.  There is a lump on your neck.  You are not getting better in 5 days.  You feel worse instead of better. Get help right away if:  You have pain that is not helped with medicine.  You have swelling, redness, or pain around your ear.  You get a stiff neck.  You cannot move part of your face (paralyzed).  You notice that the bone behind your ear hurts when you touch it.  You get a very bad headache. Summary  Otitis media means that the middle ear is red, swollen, and full of fluid.  This condition usually goes away on its own. In some cases, treatment may be needed.  If you were prescribed an antibiotic medicine, take it as told by your doctor. This information is not intended to replace advice given to you by your health care provider. Make sure you discuss any questions you have with your health care provider. Document Released: 09/09/2007 Document Revised: 04/13/2016 Document Reviewed: 04/13/2016 Elsevier Interactive Patient Education  2019 [employer].

## 2018-05-19 NOTE — Progress Notes (Signed)
Subjective:    Patient ID: Kathleen Ortiz, female    DOB: 09-29-1970, 48 y.o.   MRN: [id number]  HPI  Kathleen Ortiz is a 48 year old female who presents with a sore throat, post nasal drip, right ear pain, nasal congestion, dry cough, sinus pressure/pain, and chills Duration of symptoms: greater than one week ago She denies fever, sweats, N/V/D, SOB Treatment with Mucinex, Dayquil, "sinus tablet" have provided limited benefit.   She is taking levocetirizine and uses flonase  Recent sick contact and influenza contacts at work No recent antibiotic use. Influenza vaccine UTD History of allergic rhinitis Former smoker   Review of Systems  Constitutional: Positive for chills. Negative for fever.  HENT: Positive for congestion, ear pain, postnasal drip, sinus pressure, sinus pain and sore throat.   Respiratory: Positive for cough. Negative for shortness of breath and wheezing.   Cardiovascular: Negative for chest pain and palpitations.  Gastrointestinal: Negative for abdominal pain, nausea and vomiting.  Genitourinary: Negative for dysuria.  Musculoskeletal: Negative for myalgias.  Skin: Negative for rash.  Neurological: Negative for dizziness, light-headedness and headaches.   Past Medical History:  Diagnosis Date  . Chronic bronchitis (HCC)   . Eczema   . GERD (gastroesophageal reflux disease)   . Urticaria      Social History   Socioeconomic History  . Marital status: Single    Spouse name: Not on file  . Number of children: Not on file  . Years of education: Not on file  . Highest education level: Not on file  Occupational History  . Not on file  Social Needs  . Financial resource strain: Not on file  . Food insecurity:    Worry: Not on file    Inability: Not on file  . Transportation needs:    Medical: Not on file    Non-medical: Not on file  Tobacco Use  . Smoking status: Former Smoker    Packs/day: 0.10    Types: Cigarettes  . Smokeless tobacco: Never Used   Substance and Sexual Activity  . Alcohol use: Yes  . Drug use: No  . Sexual activity: Not on file  Lifestyle  . Physical activity:    Days per week: Not on file    Minutes per session: Not on file  . Stress: Not on file  Relationships  . Social connections:    Talks on phone: Not on file    Gets together: Not on file    Attends religious service: Not on file    Active member of club or organization: Not on file    Attends meetings of clubs or organizations: Not on file    Relationship status: Not on file  . Intimate partner violence:    Fear of current or ex partner: Not on file    Emotionally abused: Not on file    Physically abused: Not on file    Forced sexual activity: Not on file  Other Topics Concern  . Not on file  Social History Narrative   No regular exercise       Past Surgical History:  Procedure Laterality Date  . TYMPANOSTOMY TUBE PLACEMENT      Family History  Problem Relation Age of Onset  . Arthritis Mother   . Cancer Mother        Lung & colon  . Hypertension Mother   . Diabetes Maternal Aunt   . Arthritis Maternal Grandmother   . Hypertension Maternal Grandmother   . Heart  disease Maternal Grandmother   . Diabetes Maternal Grandmother   . Cancer Paternal Grandfather     Allergies  Allergen Reactions  . Shellfish Allergy   . Latex Rash    Current Outpatient Medications on File Prior to Visit  Medication Sig Dispense Refill  . FLUoxetine (PROZAC) 20 MG tablet Take 20 mg by mouth daily.    . fluticasone (FLONASE) 50 MCG/ACT nasal spray Place 2 sprays into both nostrils daily as needed for allergies or rhinitis. 16 g 5  . levocetirizine (XYZAL) 5 MG tablet TAKE 1 TABLET BY MOUTH EVERY DAY IN THE EVENING 30 tablet 5  . Olopatadine HCl (PAZEO) 0.7 % SOLN Place 1 drop into both eyes 1 day or 1 dose. 1 Bottle 5  . Olopatadine HCl 0.2 % SOLN Apply 1 drop to eye daily as needed. 2.5 mL 5  . omeprazole (PRILOSEC) 40 MG capsule Take 40 mg by mouth  daily.    . Vitamin D, Cholecalciferol, 1000 UNITS CAPS Take 2,000 Units by mouth 3 (three) times a week.    Marland Kitchen Hyoscyamine Sulfate SL (LEVSIN/SL) 0.125 MG SUBL Place 0.125 mg under the tongue every 4 (four) hours as needed (abdominal cramping). (Patient not taking: Reported on 05/19/2018) 30 each 0  . sertraline (ZOLOFT) 25 MG tablet Take 25 mg by mouth daily.     No current facility-administered medications on file prior to visit.     BP 132/78 (BP Location: Left Arm, Patient Position: Sitting, Cuff Size: Normal)   Pulse 85   Temp 98.7 F (37.1 C) (Oral)   Ht 5\' 2"  (1.575 m)   Wt 176 lb 3.2 oz (79.9 kg)   SpO2 98%   BMI 32.23 kg/m        Objective:   Physical Exam Constitutional:      Appearance: She is well-developed.  HENT:     Right Ear: No decreased hearing noted. Tympanic membrane is scarred, erythematous and bulging. Tympanic membrane is not perforated.     Left Ear: No decreased hearing noted. Tympanic membrane is scarred, erythematous and bulging. Tympanic membrane is not perforated.     Mouth/Throat:     Pharynx: Posterior oropharyngeal erythema present. No pharyngeal swelling or oropharyngeal exudate.  Eyes:     Pupils: Pupils are equal, round, and reactive to light.  Neck:     Musculoskeletal: Neck supple.  Cardiovascular:     Rate and Rhythm: Normal rate and regular rhythm.     Heart sounds: Normal heart sounds.  Pulmonary:     Effort: Pulmonary effort is normal.     Breath sounds: Normal breath sounds.  Lymphadenopathy:     Cervical: Cervical adenopathy present.  Skin:    General: Skin is warm and dry.     Capillary Refill: Capillary refill takes less than 2 seconds.  Neurological:     Mental Status: She is alert.        Assessment & Plan:  1. Acute suppurative otitis media of both ears without spontaneous rupture of tympanic membranes, recurrence not specified Exam and history are most consistent with AOM; history of AOM throughout childhood and as  an adult; Augmentin prescribed, advised continued use of Flonase,ibuprofen for discomfort,probiotic can be added, and follow up if no improvement or worsening symptoms. Return precautions provided.   2. Sore throat Rapid Strep is negative today; advised use of warm salt water gargle for symptoms. Ibuprofen for discomfort if needed.  Roddie Mc, FNP-C

## 2018-08-02 ENCOUNTER — Encounter: Payer: Self-pay | Admitting: Allergy and Immunology

## 2018-08-02 ENCOUNTER — Ambulatory Visit (INDEPENDENT_AMBULATORY_CARE_PROVIDER_SITE_OTHER): Payer: 59 | Admitting: Allergy and Immunology

## 2018-08-02 ENCOUNTER — Other Ambulatory Visit: Payer: Self-pay

## 2018-08-02 DIAGNOSIS — L500 Allergic urticaria: Secondary | ICD-10-CM | POA: Diagnosis not present

## 2018-08-02 DIAGNOSIS — J4530 Mild persistent asthma, uncomplicated: Secondary | ICD-10-CM

## 2018-08-02 DIAGNOSIS — T7800XD Anaphylactic reaction due to unspecified food, subsequent encounter: Secondary | ICD-10-CM

## 2018-08-02 DIAGNOSIS — J3089 Other allergic rhinitis: Secondary | ICD-10-CM

## 2018-08-02 HISTORY — DX: Mild persistent asthma, uncomplicated: J45.30

## 2018-08-02 MED ORDER — MONTELUKAST SODIUM 10 MG PO TABS: 10.0000 mg | ORAL_TABLET | Freq: Every day | ORAL | 5 refills | Status: DC

## 2018-08-02 MED ORDER — ALBUTEROL SULFATE HFA 108 (90 BASE) MCG/ACT IN AERS: 2.0000 | INHALATION_SPRAY | RESPIRATORY_TRACT | 3 refills | Status: DC | PRN

## 2018-08-02 NOTE — Progress Notes (Signed)
Follow-up Telemedicine Note  RE: Kathleen Ortiz MRN: [id number] DOB: May 26, 1970 Date of Telemedicine Visit: 08/02/2018  Primary care provider: Pincus Sanes, MD Referring provider: Pincus Sanes, MD  Telemedicine Follow Up Visit via Telephone: I connected with Kathleen Ortiz for a follow up on 08/02/18 by telephone and verified that I am speaking with the correct person using two identifiers.   The limitations, risks, security and privacy concerns of performing an evaluation and management service by telemedicine, the availability of in person appointments, and that there may be a patient responsible charge related to this service were discussed. The patient expressed understanding and agreed to proceed.  Patient is at home.  Provider is at the office.  Visit start time: 11:13 AM Visit end time: 11:37 AM Insurance consent/check in by: Victorino Dike Medical consent and medical assistant/nurse: Morrie Sheldon  History of present illness: Kathleen Ortiz is a 48 y.o. female with allergic rhinoconjunctivitis, food allergy, and history of allergic urticaria presenting today via telemedicine for follow-up.  She reports that over the past several months she has experienced episodes of shortness of breath and wheezing.  These lower respiratory symptoms are triggered by chemical exposure, dusting around the house, secondhand cigarette smoke, and pollen exposure.  She experiences asthma symptoms 2 times per week on average and the symptoms seem to increase around bedtime.  She does not awaken from sleep by lower respiratory symptoms. She reports that over the past 2 months she has been experiencing mild generalized urticaria on almost a daily basis.  She does not experience concomitant angioedema, cardiopulmonary symptoms, or GI symptoms.  She admits that she has not been taking montelukast or levocetirizine. She has no specific nasal allergy symptom complaints today.  Assessment and plan: Mild  persistent asthma  A prescription has been provided for montelukast 10 mg daily at bedtime.  A prescription has been provided for albuterol HFA, 1 to 2 inhalations every 4-6 hours as needed.  The patient has been asked to contact me if her symptoms persist or progress. Otherwise, she may return for follow up in 4 to 6 weeks to check spirometry.  Recurrent urticaria  Start montelukast 10 mg daily.  Instructions have been discussed and provided for H1/H2 receptor blockade with titration to find lowest effective dose.  Levocetirizine 5 mg daily as needed has been prescribed.  Food allergy  Continue careful avoidance of shellfish and beef and have access to epinephrine autoinjector 2 pack in case of accidental ingestion.  Perennial and seasonal allergic rhinitis  Continue appropriate allergen avoidance measures.  Restart montelukast daily and levocetirizine as needed.   Fluticasone nasal spray if needed.  Nasal saline spray (i.e., Simply Saline) or nasal saline lavage (i.e., NeilMed) is recommended as needed and prior to medicated nasal sprays.  If allergen avoidance measures and medications fail to adequately relieve symptoms, aeroallergen immunotherapy will be considered.   Meds ordered this encounter  Medications  . montelukast (SINGULAIR) 10 MG tablet    Sig: Take 1 tablet (10 mg total) by mouth at bedtime.    Dispense:  30 tablet    Refill:  5  . albuterol (PROAIR HFA) 108 (90 Base) MCG/ACT inhaler    Sig: Inhale 2 puffs into the lungs every 4 (four) hours as needed for wheezing or shortness of breath.    Dispense:  1 Inhaler    Refill:  3    Diagnostics: None.   Physical examination: Physical Exam Not obtained as encounter was done via telephone.  The following portions of the patient's history were reviewed and updated as appropriate: allergies, current medications, past family history, past medical history, past social history, past surgical history and  problem list.  Allergies as of 08/02/2018      Reactions   Shellfish Allergy    Latex Rash      Medication List       Accurate as of August 02, 2018 12:34 PM. Always use your most recent med list.        albuterol 108 (90 Base) MCG/ACT inhaler Commonly known as:  ProAir HFA Inhale 2 puffs into the lungs every 4 (four) hours as needed for wheezing or shortness of breath.   escitalopram 20 MG tablet Commonly known as:  LEXAPRO Take 20 mg by mouth daily.   FLUoxetine 20 MG tablet Commonly known as:  PROZAC Take 20 mg by mouth daily.   fluticasone 50 MCG/ACT nasal spray Commonly known as:  FLONASE Place 2 sprays into both nostrils daily as needed for allergies or rhinitis.   levocetirizine 5 MG tablet Commonly known as:  XYZAL TAKE 1 TABLET BY MOUTH EVERY DAY IN THE EVENING   montelukast 10 MG tablet Commonly known as:  Singulair Take 1 tablet (10 mg total) by mouth at bedtime.   Olopatadine HCl 0.2 % Soln Apply 1 drop to eye daily as needed.   omeprazole 40 MG capsule Commonly known as:  PRILOSEC Take 40 mg by mouth daily.   Vitamin D (Cholecalciferol) 25 MCG (1000 UT) Caps Take 2,000 Units by mouth 3 (three) times a week.       Allergies  Allergen Reactions  . Shellfish Allergy   . Latex Rash   Review of systems: Review of systems negative except as noted in HPI / PMHx or noted below: Constitutional: Negative.  HENT: Negative.   Eyes: Negative.  Respiratory: Negative.   Cardiovascular: Negative.  Gastrointestinal: Negative.  Genitourinary: Negative.  Musculoskeletal: Negative.  Neurological: Negative.  Endo/Heme/Allergies: Negative.  Cutaneous: Negative.  Past Medical History:  Diagnosis Date  . Chronic bronchitis (HCC)   . Eczema   . GERD (gastroesophageal reflux disease)   . Mild persistent asthma 08/02/2018  . Urticaria     Family History  Problem Relation Age of Onset  . Arthritis Mother   . Cancer Mother        Lung & colon  .  Hypertension Mother   . Diabetes Maternal Aunt   . Arthritis Maternal Grandmother   . Hypertension Maternal Grandmother   . Heart disease Maternal Grandmother   . Diabetes Maternal Grandmother   . Cancer Paternal Grandfather     Social History   Socioeconomic History  . Marital status: Single    Spouse name: Not on file  . Number of children: Not on file  . Years of education: Not on file  . Highest education level: Not on file  Occupational History  . Not on file  Social Needs  . Financial resource strain: Not on file  . Food insecurity:    Worry: Not on file    Inability: Not on file  . Transportation needs:    Medical: Not on file    Non-medical: Not on file  Tobacco Use  . Smoking status: Former Smoker    Packs/day: 0.10    Types: Cigarettes  . Smokeless tobacco: Never Used  Substance and Sexual Activity  . Alcohol use: Yes  . Drug use: No  . Sexual activity: Not on file  Lifestyle  .  Physical activity:    Days per week: Not on file    Minutes per session: Not on file  . Stress: Not on file  Relationships  . Social connections:    Talks on phone: Not on file    Gets together: Not on file    Attends religious service: Not on file    Active member of club or organization: Not on file    Attends meetings of clubs or organizations: Not on file    Relationship status: Not on file  . Intimate partner violence:    Fear of current or ex partner: Not on file    Emotionally abused: Not on file    Physically abused: Not on file    Forced sexual activity: Not on file  Other Topics Concern  . Not on file  Social History Narrative   No regular exercise        Previous notes and tests were reviewed.  I discussed the assessment and treatment plan with the patient. The patient was provided an opportunity to ask questions and all were answered. The patient agreed with the plan and demonstrated an understanding of the instructions.   The patient was advised to call  back or seek an in-person evaluation if the symptoms worsen or if the condition fails to improve as anticipated.  I provided 24 minutes of non-face-to-face time during this encounter.  I appreciate the opportunity to take part in Kathleen Ortiz's care. Please do not hesitate to contact me with questions.  Sincerely,   R. Jorene Guestarter Zaul Hubers, MD

## 2018-08-02 NOTE — Assessment & Plan Note (Signed)
   Continue careful avoidance of shellfish and beef and have access to epinephrine autoinjector 2 pack in case of accidental ingestion.

## 2018-08-02 NOTE — Assessment & Plan Note (Signed)
   A prescription has been provided for montelukast 10 mg daily at bedtime.  A prescription has been provided for albuterol HFA, 1 to 2 inhalations every 4-6 hours as needed.  The patient has been asked to contact me if her symptoms persist or progress. Otherwise, she may return for follow up in 4 to 6 weeks to check spirometry.

## 2018-08-02 NOTE — Assessment & Plan Note (Signed)
   Continue appropriate allergen avoidance measures.  Restart montelukast daily and levocetirizine as needed.   Fluticasone nasal spray if needed.  Nasal saline spray (i.e., Simply Saline) or nasal saline lavage (i.e., NeilMed) is recommended as needed and prior to medicated nasal sprays.  If allergen avoidance measures and medications fail to adequately relieve symptoms, aeroallergen immunotherapy will be considered.

## 2018-08-02 NOTE — Assessment & Plan Note (Signed)
   Start montelukast 10 mg daily.  Instructions have been discussed and provided for H1/H2 receptor blockade with titration to find lowest effective dose.  Levocetirizine 5 mg daily as needed has been prescribed.

## 2018-08-02 NOTE — Patient Instructions (Addendum)
Mild persistent asthma  A prescription has been provided for montelukast 10 mg daily at bedtime.  A prescription has been provided for albuterol HFA, 1 to 2 inhalations every 4-6 hours as needed.  The patient has been asked to contact me if her symptoms persist or progress. Otherwise, she may return for follow up in 4 to 6 weeks to check spirometry.  Recurrent urticaria  Start montelukast 10 mg daily.  Instructions have been discussed and provided for H1/H2 receptor blockade with titration to find lowest effective dose.  Levocetirizine 5 mg daily as needed has been prescribed.  Food allergy  Continue careful avoidance of shellfish and beef and have access to epinephrine autoinjector 2 pack in case of accidental ingestion.  Perennial and seasonal allergic rhinitis  Continue appropriate allergen avoidance measures.  Restart montelukast daily and levocetirizine as needed.   Fluticasone nasal spray if needed.  Nasal saline spray (i.e., Simply Saline) or nasal saline lavage (i.e., NeilMed) is recommended as needed and prior to medicated nasal sprays.  If allergen avoidance measures and medications fail to adequately relieve symptoms, aeroallergen immunotherapy will be considered.   Return in about 4-6 weeks, or if symptoms worsen or fail to improve.  Urticaria (Hives)  . Levocetirizine (Xyzal) 5 mg twice a day and famotidine (Pepcid) 20 mg twice a day. If no symptoms for 7-14 days then decrease to. . Levocetirizine (Xyzal) 5 mg twice a day and famotidine (Pepcid) 20 mg once a day.  If no symptoms for 7-14 days then decrease to. . Levocetirizine (Xyzal) 5 mg twice a day.  If no symptoms for 7-14 days then decrease to. . Levocetirizine (Xyzal) 5 mg once a day.  May use Benadryl (diphenhydramine) as needed for breakthrough symptoms       If symptoms return, then step up dosage

## 2018-08-10 DIAGNOSIS — R141 Gas pain: Secondary | ICD-10-CM | POA: Diagnosis not present

## 2018-08-10 DIAGNOSIS — K219 Gastro-esophageal reflux disease without esophagitis: Secondary | ICD-10-CM | POA: Diagnosis not present

## 2018-08-18 ENCOUNTER — Telehealth: Payer: Self-pay | Admitting: *Deleted

## 2018-08-18 DIAGNOSIS — J4530 Mild persistent asthma, uncomplicated: Secondary | ICD-10-CM

## 2018-08-18 DIAGNOSIS — J3089 Other allergic rhinitis: Secondary | ICD-10-CM

## 2018-08-18 MED ORDER — ALBUTEROL SULFATE HFA 108 (90 BASE) MCG/ACT IN AERS: 1.0000 | INHALATION_SPRAY | RESPIRATORY_TRACT | 0 refills | Status: DC | PRN

## 2018-08-18 MED ORDER — MONTELUKAST SODIUM 10 MG PO TABS: 10.0000 mg | ORAL_TABLET | Freq: Every day | ORAL | 1 refills | Status: DC

## 2018-08-18 NOTE — Telephone Encounter (Signed)
Patient called office stating she has not received her Albuterol or Montelukast from [facility] yet.  Medications were sent in as 30 day supply and need to be sent in as 90 day supply for mail in pharmacy. New RX  for ProAir and Montelukast sent to [facility] as 90 day supply.

## 2018-08-19 ENCOUNTER — Other Ambulatory Visit: Payer: Self-pay | Admitting: *Deleted

## 2018-08-19 DIAGNOSIS — J4530 Mild persistent asthma, uncomplicated: Secondary | ICD-10-CM

## 2018-08-19 DIAGNOSIS — J3089 Other allergic rhinitis: Secondary | ICD-10-CM

## 2018-08-19 MED ORDER — ALBUTEROL SULFATE HFA 108 (90 BASE) MCG/ACT IN AERS: 2.0000 | INHALATION_SPRAY | RESPIRATORY_TRACT | 0 refills | Status: DC | PRN

## 2018-08-19 MED ORDER — MONTELUKAST SODIUM 10 MG PO TABS: 10.0000 mg | ORAL_TABLET | Freq: Every day | ORAL | 1 refills | Status: DC

## 2018-08-30 ENCOUNTER — Telehealth: Payer: Self-pay | Admitting: Allergy and Immunology

## 2018-08-30 DIAGNOSIS — J4530 Mild persistent asthma, uncomplicated: Secondary | ICD-10-CM

## 2018-08-30 DIAGNOSIS — J3089 Other allergic rhinitis: Secondary | ICD-10-CM

## 2018-08-30 MED ORDER — OLOPATADINE HCL 0.2 % OP SOLN: 1.0000 [drp] | Freq: Every day | OPHTHALMIC | 5 refills | Status: DC | PRN

## 2018-08-30 MED ORDER — MONTELUKAST SODIUM 10 MG PO TABS: 10.0000 mg | ORAL_TABLET | Freq: Every day | ORAL | 1 refills | Status: DC

## 2018-08-30 MED ORDER — LEVOCETIRIZINE DIHYDROCHLORIDE 5 MG PO TABS: ORAL_TABLET | ORAL | 5 refills | Status: DC

## 2018-08-30 MED ORDER — ALBUTEROL SULFATE HFA 108 (90 BASE) MCG/ACT IN AERS: 2.0000 | INHALATION_SPRAY | RESPIRATORY_TRACT | 0 refills | Status: DC | PRN

## 2018-08-30 MED ORDER — FLUTICASONE PROPIONATE 50 MCG/ACT NA SUSP: 2.0000 | Freq: Every day | NASAL | 5 refills | Status: DC | PRN

## 2018-08-30 NOTE — Telephone Encounter (Signed)
Spoke with [facility].  After a lot of research, I looked at most recent insurance card, and patient pharmacy benefits are through [facility].  I spoke with patient and advised.  She voiced understanding and apologized for giving [place] the incorrect info.  Medications have been sent to appropriate pharmacy.

## 2018-08-30 NOTE — Telephone Encounter (Signed)
Patient is calling stating that she hasnt received any of her medications [facility] States she needs a refill on all medications and she called [facility] a week ago to get the refills and hasnt heard anything or received anything  Is there a delay??

## 2018-09-06 ENCOUNTER — Other Ambulatory Visit: Payer: Self-pay

## 2018-09-06 DIAGNOSIS — J4530 Mild persistent asthma, uncomplicated: Secondary | ICD-10-CM

## 2018-09-06 DIAGNOSIS — J3089 Other allergic rhinitis: Secondary | ICD-10-CM

## 2018-09-06 MED ORDER — ALBUTEROL SULFATE HFA 108 (90 BASE) MCG/ACT IN AERS: 2.0000 | INHALATION_SPRAY | RESPIRATORY_TRACT | 0 refills | Status: DC | PRN

## 2018-09-06 MED ORDER — OLOPATADINE HCL 0.2 % OP SOLN: 1.0000 [drp] | Freq: Every day | OPHTHALMIC | 5 refills | Status: DC | PRN

## 2018-09-06 MED ORDER — MONTELUKAST SODIUM 10 MG PO TABS: 10.0000 mg | ORAL_TABLET | Freq: Every day | ORAL | 1 refills | Status: DC

## 2018-09-06 MED ORDER — LEVOCETIRIZINE DIHYDROCHLORIDE 5 MG PO TABS: ORAL_TABLET | ORAL | 5 refills | Status: DC

## 2018-09-06 MED ORDER — FLUTICASONE PROPIONATE 50 MCG/ACT NA SUSP: 2.0000 | Freq: Every day | NASAL | 5 refills | Status: DC | PRN

## 2018-09-14 ENCOUNTER — Telehealth: Payer: Self-pay | Admitting: Internal Medicine

## 2018-09-14 NOTE — Telephone Encounter (Signed)
Patient called and says she would like an appointment to have her yearly lab work drawn and her yearly physical. She also mentioned she would like to have a covid test at the lab because she received a letter from her job that someone in the plant was positive, but she wasn't in contact with them, she denies any symptoms. I advised covid tests are not done at the office lab, that she will need a referral from her doctor to go to a testing site, she verbalized understanding.  I advised I will send her call to the office to schedule the OV and labs. I called the office and spoke to Sam, [facility] who asked to speak to the patient, the call was connected successfully.

## 2018-10-03 ENCOUNTER — Ambulatory Visit: Payer: 59 | Admitting: Allergy and Immunology

## 2018-10-14 ENCOUNTER — Other Ambulatory Visit: Payer: Self-pay | Admitting: Family Medicine

## 2018-10-14 ENCOUNTER — Ambulatory Visit: Payer: Self-pay

## 2018-10-14 ENCOUNTER — Other Ambulatory Visit: Payer: Self-pay

## 2018-10-14 DIAGNOSIS — M79641 Pain in right hand: Secondary | ICD-10-CM

## 2018-10-25 NOTE — Progress Notes (Signed)
Subjective:    Patient ID: Kathleen Ortiz, female    DOB: Aug 13, 1970, 48 y.o.   MRN: [id number]  HPI She is here for a physical exam.   She is exercising and having difficulty losing weight.  She has lost a few pounds.  She wonders if the Lexapro is affecting this.  She wonders if she should see a nutritionist.   She gets feet, finger, thigh and stomach swelling.  She feels  puffy every day.  She may consume more salt than she should.  She wonders if she should have a water pill.  She drinks a good amount of water.  She does not feel she is urinating as much as she should.    Medications and allergies reviewed with patient and updated if appropriate.  Patient Active Problem List   Diagnosis Date Noted  . Mild persistent asthma 08/02/2018  . Fatty liver 12/11/2017  . Allergic conjunctivitis 07/26/2017  . Abnormal urine odor 04/29/2017  . Fatigue 08/03/2016  . Nausea 08/03/2016  . Lightheadedness 08/03/2016  . Gastroesophageal reflux disease 08/03/2016  . Elevated blood pressure reading 08/03/2016  . At risk of diabetes mellitus 08/03/2016  . Recurrent urticaria 05/11/2016  . Food allergy 05/11/2016  . Itching 03/17/2016  . Rash and nonspecific skin eruption 03/17/2016  . Right shoulder pain 08/02/2015  . Perennial and seasonal allergic rhinitis 03/20/2015  . Vitamin D deficiency 03/20/2015  . Pain of both shoulder joints 03/20/2015  . Eczema 03/20/2015    Current Outpatient Medications on File Prior to Visit  Medication Sig Dispense Refill  . albuterol (PROAIR HFA) 108 (90 Base) MCG/ACT inhaler Inhale 2 puffs into the lungs every 4 (four) hours as needed for wheezing or shortness of breath. 3 Inhaler 0  . escitalopram (LEXAPRO) 20 MG tablet Take 20 mg by mouth daily.    . fluticasone (FLONASE) 50 MCG/ACT nasal spray Place 2 sprays into both nostrils daily as needed for allergies or rhinitis. 16 g 5  . levocetirizine (XYZAL) 5 MG tablet TAKE 1 TABLET BY MOUTH EVERY  DAY IN THE EVENING 30 tablet 5  . montelukast (SINGULAIR) 10 MG tablet Take 1 tablet (10 mg total) by mouth at bedtime. 90 tablet 1  . Olopatadine HCl 0.2 % SOLN Apply 1 drop to eye daily as needed. 2.5 mL 5  . omeprazole (PRILOSEC) 40 MG capsule Take 40 mg by mouth daily.    . Vitamin D, Cholecalciferol, 1000 UNITS CAPS Take 2,000 Units by mouth 3 (three) times a week.     No current facility-administered medications on file prior to visit.     Past Medical History:  Diagnosis Date  . Chronic bronchitis ([facility])   . Eczema   . GERD (gastroesophageal reflux disease)   . Mild persistent asthma 08/02/2018  . Urticaria     Past Surgical History:  Procedure Laterality Date  . TYMPANOSTOMY TUBE PLACEMENT      Social History   Socioeconomic History  . Marital status: Single    Spouse name: Not on file  . Number of children: Not on file  . Years of education: Not on file  . Highest education level: Not on file  Occupational History  . Not on file  Social Needs  . Financial resource strain: Not on file  . Food insecurity    Worry: Not on file    Inability: Not on file  . Transportation needs    Medical: Not on file    Non-medical: Not  on file  Tobacco Use  . Smoking status: Former Smoker    Packs/day: 0.10    Types: Cigarettes  . Smokeless tobacco: Never Used  Substance and Sexual Activity  . Alcohol use: Yes  . Drug use: No  . Sexual activity: Not on file  Lifestyle  . Physical activity    Days per week: Not on file    Minutes per session: Not on file  . Stress: Not on file  Relationships  . Social Herbalist on phone: Not on file    Gets together: Not on file    Attends religious service: Not on file    Active member of club or organization: Not on file    Attends meetings of clubs or organizations: Not on file    Relationship status: Not on file  Other Topics Concern  . Not on file  Social History Narrative   No regular exercise       Family  History  Problem Relation Age of Onset  . Arthritis Mother   . Cancer Mother        Lung & colon  . Hypertension Mother   . Diabetes Maternal Aunt   . Arthritis Maternal Grandmother   . Hypertension Maternal Grandmother   . Heart disease Maternal Grandmother   . Diabetes Maternal Grandmother   . Cancer Paternal Grandfather     Review of Systems  Constitutional: Negative for chills and fever.  Eyes: Positive for itching (allergy related). Negative for visual disturbance.  Respiratory: Positive for wheezing (occ, allergy related -uses albuterol). Negative for cough and shortness of breath.   Cardiovascular: Positive for palpitations (occasional) and leg swelling (feet - frequently swollen, sore). Negative for chest pain.  Gastrointestinal: Positive for abdominal distention (bloating) and nausea. Negative for abdominal pain, blood in stool, constipation and diarrhea.       GERD controlled  Genitourinary: Negative for dysuria and hematuria.  Musculoskeletal: Positive for myalgias. Negative for arthralgias.  Skin: Negative for color change and rash.  Neurological: Negative for light-headedness and headaches.  Psychiatric/Behavioral: Negative for dysphoric mood. The patient is not nervous/anxious.        Objective:   Vitals:   10/26/18 0841  BP: 110/64  Pulse: 83  Resp: 16  Temp: 98.5 F (36.9 C)  SpO2: 98%   Filed Weights   10/26/18 0841  Weight: 176 lb (79.8 kg)   Body mass index is 32.19 kg/m.  BP Readings from Last 3 Encounters:  10/26/18 110/64  05/19/18 132/78  12/10/17 126/86    Wt Readings from Last 3 Encounters:  10/26/18 176 lb (79.8 kg)  05/19/18 176 lb 3.2 oz (79.9 kg)  12/10/17 164 lb 1.9 oz (74.4 kg)     Physical Exam Constitutional: She appears well-developed and well-nourished. No distress.  HENT:  Head: Normocephalic and atraumatic.  Right Ear: External ear normal. Normal ear canal and TM Left Ear: External ear normal.  Normal ear canal and  TM Mouth/Throat: Oropharynx is clear and moist.  Eyes: Conjunctivae and EOM are normal.  Neck: Neck supple. No tracheal deviation present. No thyromegaly present.  No carotid bruit  Cardiovascular: Normal rate, regular rhythm and normal heart sounds.  No murmur heard.  No edema. Pulmonary/Chest: Effort normal and breath sounds normal. No respiratory distress. She has no wheezes. She has no rales.  Breast: deferred   Abdominal: Soft. She exhibits no distension. There is no tenderness.  Lymphadenopathy: She has no cervical adenopathy.  Skin: Skin is  warm and dry. She is not diaphoretic.  Psychiatric: She has a normal mood and affect. Her behavior is normal.        Assessment & Plan:   Physical exam: Screening blood work ordered Immunizations   tdap today Mammogram Up to date  Gyn   up to date  Exercise  Walks 3-4 miles a day, sit-ups, squats, lunges Weight  Has lost some weight - working on weight loss Skin    no concerns Substance abuse   none  See Problem List for Assessment and Plan of chronic medical problems.   Follow-up in 1 year, sooner if needed

## 2018-10-25 NOTE — Patient Instructions (Addendum)
Tests ordered today. Your results will be released to MyChart (or called to you) after review.  If any changes need to be made, you will be notified at that same time.  All other Health Maintenance issues reviewed.   All recommended immunizations and age-appropriate screenings are up-to-date or discussed.  Tetanus immunization administered today.   Medications reviewed and updated.  Changes include :   none  Your prescription(s) have been submitted to your pharmacy. Please take as directed and contact our office if you believe you are having problem(s) with the medication(s).   Please followup in 1 year    Health Maintenance, Female Adopting a healthy lifestyle and getting preventive care are important in promoting health and wellness. Ask your health care provider about:  The right schedule for you to have regular tests and exams.  Things you can do on your own to prevent diseases and keep yourself healthy. What should I know about diet, weight, and exercise? Eat a healthy diet   Eat a diet that includes plenty of vegetables, fruits, low-fat dairy products, and lean protein.  Do not eat a lot of foods that are high in solid fats, added sugars, or sodium. Maintain a healthy weight Body mass index (BMI) is used to identify weight problems. It estimates body fat based on height and weight. Your health care provider can help determine your BMI and help you achieve or maintain a healthy weight. Get regular exercise Get regular exercise. This is one of the most important things you can do for your health. Most adults should:  Exercise for at least 150 minutes each week. The exercise should increase your heart rate and make you sweat (moderate-intensity exercise).  Do strengthening exercises at least twice a week. This is in addition to the moderate-intensity exercise.  Spend less time sitting. Even light physical activity can be beneficial. Watch cholesterol and blood lipids Have  your blood tested for lipids and cholesterol at 47 years of age, then have this test every 5 years. Have your cholesterol levels checked more often if:  Your lipid or cholesterol levels are high.  You are older than 47 years of age.  You are at high risk for heart disease. What should I know about cancer screening? Depending on your health history and family history, you may need to have cancer screening at various ages. This may include screening for:  Breast cancer.  Cervical cancer.  Colorectal cancer.  Skin cancer.  Lung cancer. What should I know about heart disease, diabetes, and high blood pressure? Blood pressure and heart disease  High blood pressure causes heart disease and increases the risk of stroke. This is more likely to develop in people who have high blood pressure readings, are of African descent, or are overweight.  Have your blood pressure checked: ? Every 3-5 years if you are 18-39 years of age. ? Every year if you are 40 years old or older. Diabetes Have regular diabetes screenings. This checks your fasting blood sugar level. Have the screening done:  Once every three years after age 40 if you are at a normal weight and have a low risk for diabetes.  More often and at a younger age if you are overweight or have a high risk for diabetes. What should I know about preventing infection? Hepatitis B If you have a higher risk for hepatitis B, you should be screened for this virus. Talk with your health care provider to find out if you are at risk   for hepatitis B infection. Hepatitis C Testing is recommended for:  Everyone born from 1945 through 1965.  Anyone with known risk factors for hepatitis C. Sexually transmitted infections (STIs)  Get screened for STIs, including gonorrhea and chlamydia, if: ? You are sexually active and are younger than 47 years of age. ? You are older than 47 years of age and your health care provider tells you that you are at  risk for this type of infection. ? Your sexual activity has changed since you were last screened, and you are at increased risk for chlamydia or gonorrhea. Ask your health care provider if you are at risk.  Ask your health care provider about whether you are at high risk for HIV. Your health care provider may recommend a prescription medicine to help prevent HIV infection. If you choose to take medicine to prevent HIV, you should first get tested for HIV. You should then be tested every 3 months for as long as you are taking the medicine. Pregnancy  If you are about to stop having your period (premenopausal) and you may become pregnant, seek counseling before you get pregnant.  Take 400 to 800 micrograms (mcg) of folic acid every day if you become pregnant.  Ask for birth control (contraception) if you want to prevent pregnancy. Osteoporosis and menopause Osteoporosis is a disease in which the bones lose minerals and strength with aging. This can result in bone fractures. If you are 65 years old or older, or if you are at risk for osteoporosis and fractures, ask your health care provider if you should:  Be screened for bone loss.  Take a calcium or vitamin D supplement to lower your risk of fractures.  Be given hormone replacement therapy (HRT) to treat symptoms of menopause. Follow these instructions at home: Lifestyle  Do not use any products that contain nicotine or tobacco, such as cigarettes, e-cigarettes, and chewing tobacco. If you need help quitting, ask your health care provider.  Do not use street drugs.  Do not share needles.  Ask your health care provider for help if you need support or information about quitting drugs. Alcohol use  Do not drink alcohol if: ? Your health care provider tells you not to drink. ? You are pregnant, may be pregnant, or are planning to become pregnant.  If you drink alcohol: ? Limit how much you use to 0-1 drink a day. ? Limit intake if you  are breastfeeding.  Be aware of how much alcohol is in your drink. In the [place] one drink equals one 12 oz bottle of beer (355 mL), one 5 oz glass of wine (148 mL), or one 1 oz glass of hard liquor (44 mL). General instructions  Schedule regular health, dental, and eye exams.  Stay current with your vaccines.  Tell your health care provider if: ? You often feel depressed. ? You have ever been abused or do not feel safe at home. Summary  Adopting a healthy lifestyle and getting preventive care are important in promoting health and wellness.  Follow your health care provider's instructions about healthy diet, exercising, and getting tested or screened for diseases.  Follow your health care provider's instructions on monitoring your cholesterol and blood pressure. This information is not intended to replace advice given to you by your health care provider. Make sure you discuss any questions you have with your health care provider. Document Released: 10/06/2010 Document Revised: 03/16/2018 Document Reviewed: 03/16/2018 Elsevier Patient Education  2020 [employer] Inc[employer]  

## 2018-10-26 ENCOUNTER — Other Ambulatory Visit (INDEPENDENT_AMBULATORY_CARE_PROVIDER_SITE_OTHER): Payer: 59

## 2018-10-26 ENCOUNTER — Encounter: Payer: Self-pay | Admitting: Internal Medicine

## 2018-10-26 ENCOUNTER — Other Ambulatory Visit: Payer: Self-pay

## 2018-10-26 ENCOUNTER — Ambulatory Visit (INDEPENDENT_AMBULATORY_CARE_PROVIDER_SITE_OTHER): Payer: 59 | Admitting: Internal Medicine

## 2018-10-26 VITALS — BP 110/64 | HR 83 | Temp 98.5°F | Resp 16 | Ht 62.0 in | Wt 176.0 lb

## 2018-10-26 DIAGNOSIS — Z0000 Encounter for general adult medical examination without abnormal findings: Secondary | ICD-10-CM | POA: Diagnosis not present

## 2018-10-26 DIAGNOSIS — R739 Hyperglycemia, unspecified: Secondary | ICD-10-CM

## 2018-10-26 DIAGNOSIS — N951 Menopausal and female climacteric states: Secondary | ICD-10-CM | POA: Insufficient documentation

## 2018-10-26 DIAGNOSIS — Z23 Encounter for immunization: Secondary | ICD-10-CM

## 2018-10-26 DIAGNOSIS — E669 Obesity, unspecified: Secondary | ICD-10-CM | POA: Insufficient documentation

## 2018-10-26 DIAGNOSIS — E6609 Other obesity due to excess calories: Secondary | ICD-10-CM

## 2018-10-26 DIAGNOSIS — K219 Gastro-esophageal reflux disease without esophagitis: Secondary | ICD-10-CM

## 2018-10-26 DIAGNOSIS — E559 Vitamin D deficiency, unspecified: Secondary | ICD-10-CM

## 2018-10-26 DIAGNOSIS — Z6832 Body mass index (BMI) 32.0-32.9, adult: Secondary | ICD-10-CM

## 2018-10-26 DIAGNOSIS — Z9189 Other specified personal risk factors, not elsewhere classified: Secondary | ICD-10-CM

## 2018-10-26 LAB — COMPREHENSIVE METABOLIC PANEL
ALT: 12 U/L (ref 0–35)
AST: 15 U/L (ref 0–37)
Albumin: 4.3 g/dL (ref 3.5–5.2)
Alkaline Phosphatase: 87 U/L (ref 39–117)
BUN: 8 mg/dL (ref 6–23)
CO2: 27 mEq/L (ref 19–32)
Calcium: 9.2 mg/dL (ref 8.4–10.5)
Chloride: 104 mEq/L (ref 96–112)
Creatinine, Ser: 0.76 mg/dL (ref 0.40–1.20)
GFR: 98.6 mL/min (ref >60.00)
Glucose, Bld: 93 mg/dL (ref 70–99)
Potassium: 4 mEq/L (ref 3.5–5.1)
Sodium: 138 mEq/L (ref 135–145)
Total Bilirubin: 0.6 mg/dL (ref 0.2–1.2)
Total Protein: 7.7 g/dL (ref 6.0–8.3)

## 2018-10-26 LAB — CBC WITH DIFFERENTIAL/PLATELET
Basophils Absolute: 0.1 10*3/uL (ref 0.0–0.1)
Basophils Relative: 2.1 % (ref 0.0–3.0)
Eosinophils Absolute: 0.2 10*3/uL (ref 0.0–0.7)
Eosinophils Relative: 3.3 % (ref 0.0–5.0)
HCT: 37.2 % (ref 36.0–46.0)
Hemoglobin: 12.4 g/dL (ref 12.0–15.0)
Lymphocytes Relative: 34.7 % (ref 12.0–46.0)
Lymphs Abs: 1.6 10*3/uL (ref 0.7–4.0)
MCHC: 33.2 g/dL (ref 30.0–36.0)
MCV: 90.1 fl (ref 78.0–100.0)
Monocytes Absolute: 0.5 10*3/uL (ref 0.1–1.0)
Monocytes Relative: 10.2 % (ref 3.0–12.0)
Neutro Abs: 2.2 10*3/uL (ref 1.4–7.7)
Neutrophils Relative %: 49.7 % (ref 43.0–77.0)
Platelets: 218 10*3/uL (ref 150.0–400.0)
RBC: 4.13 Mil/uL (ref 3.87–5.11)
RDW: 12.9 % (ref 11.5–15.5)
WBC: 4.5 10*3/uL (ref 4.0–10.5)

## 2018-10-26 LAB — TSH: TSH: 0.63 u[IU]/mL (ref 0.35–4.50)

## 2018-10-26 LAB — LIPID PANEL
Cholesterol: 155 mg/dL (ref 0–200)
HDL: 51.5 mg/dL (ref >39.00)
LDL Cholesterol: 88 mg/dL (ref 0–99)
NonHDL: 103.48
Total CHOL/HDL Ratio: 3
Triglycerides: 78 mg/dL (ref 0.0–149.0)
VLDL: 15.6 mg/dL (ref 0.0–40.0)

## 2018-10-26 LAB — HEMOGLOBIN A1C: Hgb A1c MFr Bld: 5.6 % (ref 4.6–6.5)

## 2018-10-26 NOTE — Assessment & Plan Note (Signed)
Has lost some weight, but having difficulty losing weight Lexapro may be contributing to difficulty losing weight, she is also perimenopausal Can refer to nutrition, but discussed this may not be covered by her insurance-she will think about it Recommended trying my fitness pal app to see if that helps Revise diet Continue regular exercise

## 2018-10-26 NOTE — Assessment & Plan Note (Signed)
Taking vitamin D daily-continue

## 2018-10-26 NOTE — Assessment & Plan Note (Signed)
Check A1c. 

## 2018-10-26 NOTE — Assessment & Plan Note (Signed)
GERD controlled with omeprazole-she needs to take this to avoid the symptoms Continue for now Working on weight loss

## 2018-10-26 NOTE — Assessment & Plan Note (Addendum)
Taking lexapro - prescribed by gyn This is helping she will continue it

## 2018-11-21 ENCOUNTER — Ambulatory Visit: Payer: 59 | Admitting: Allergy and Immunology

## 2018-11-22 ENCOUNTER — Ambulatory Visit: Payer: 59 | Admitting: Orthopaedic Surgery

## 2018-11-29 NOTE — Progress Notes (Signed)
Subjective:    Patient ID: Kathleen Ortiz, female    DOB: [birth year], 48 y.o.   MRN: [id number]  HPI The patient is here for an acute visit.   Pain, and bump on left side of abdomen:  It started about two weeks ago.  The pain started first in her anterior lower left ribs.  The pain has expanded to her left and lateral left abdomen.  It is a burning type pain.  A bump came up in the LUQ and it is painful.  It has gotten larger.  She thought the bump was causing the pain.  She denies any discharge.  She has not had any fevers or chills.  She thinks it is a cyst.  She does have some middle back pain.  She denies any injuries or unusual activities.  She has had some headaches recently.  She has not had any change in stool, blood in the stool.  Her GERD is currently controlled.   Medications and allergies reviewed with patient and updated if appropriate.  Patient Active Problem List   Diagnosis Date Noted  . Infected cyst of skin 11/30/2018  . Neuralgia of flank, left 11/30/2018  . Perimenopausal symptoms 10/26/2018  . Obesity 10/26/2018  . Mild persistent asthma 08/02/2018  . Fatty liver 12/11/2017  . Allergic conjunctivitis 07/26/2017  . Gastroesophageal reflux disease 08/03/2016  . At risk of diabetes mellitus 08/03/2016  . Recurrent urticaria 05/11/2016  . Food allergy 05/11/2016  . Rash and nonspecific skin eruption 03/17/2016  . Right shoulder pain 08/02/2015  . Perennial and seasonal allergic rhinitis 03/20/2015  . Vitamin D deficiency 03/20/2015  . Eczema 03/20/2015    Current Outpatient Medications on File Prior to Visit  Medication Sig Dispense Refill  . albuterol (PROAIR HFA) 108 (90 Base) MCG/ACT inhaler Inhale 2 puffs into the lungs every 4 (four) hours as needed for wheezing or shortness of breath. 3 Inhaler 0  . escitalopram (LEXAPRO) 20 MG tablet Take 20 mg by mouth daily.    . fluticasone (FLONASE) 50 MCG/ACT nasal spray Place 2 sprays into both nostrils  daily as needed for allergies or rhinitis. 16 g 5  . levocetirizine (XYZAL) 5 MG tablet TAKE 1 TABLET BY MOUTH EVERY DAY IN THE EVENING 30 tablet 5  . montelukast (SINGULAIR) 10 MG tablet Take 1 tablet (10 mg total) by mouth at bedtime. 90 tablet 1  . Olopatadine HCl 0.2 % SOLN Apply 1 drop to eye daily as needed. 2.5 mL 5  . omeprazole (PRILOSEC) 40 MG capsule Take 40 mg by mouth daily.    . Vitamin D, Cholecalciferol, 1000 UNITS CAPS Take 2,000 Units by mouth 3 (three) times a week.     No current facility-administered medications on file prior to visit.     Past Medical History:  Diagnosis Date  . Chronic bronchitis ([place])   . Eczema   . GERD (gastroesophageal reflux disease)   . Mild persistent asthma 08/02/2018  . Urticaria     Past Surgical History:  Procedure Laterality Date  . TYMPANOSTOMY TUBE PLACEMENT      Social History   Socioeconomic History  . Marital status: Single    Spouse name: Not on file  . Number of children: Not on file  . Years of education: Not on file  . Highest education level: Not on file  Occupational History  . Not on file  Social Needs  . Financial resource strain: Not on file  . Food insecurity  Worry: Not on file    Inability: Not on file  . Transportation needs    Medical: Not on file    Non-medical: Not on file  Tobacco Use  . Smoking status: Former Smoker    Packs/day: 0.10    Types: Cigarettes  . Smokeless tobacco: Never Used  Substance and Sexual Activity  . Alcohol use: Yes  . Drug use: No  . Sexual activity: Not on file  Lifestyle  . Physical activity    Days per week: Not on file    Minutes per session: Not on file  . Stress: Not on file  Relationships  . Social Musicianconnections    Talks on phone: Not on file    Gets together: Not on file    Attends religious service: Not on file    Active member of club or organization: Not on file    Attends meetings of clubs or organizations: Not on file    Relationship status: Not  on file  Other Topics Concern  . Not on file  Social History Narrative   No regular exercise       Family History  Problem Relation Age of Onset  . Arthritis Mother   . Cancer Mother        Lung & colon  . Hypertension Mother   . Diabetes Maternal Aunt   . Arthritis Maternal Grandmother   . Hypertension Maternal Grandmother   . Heart disease Maternal Grandmother   . Diabetes Maternal Grandmother   . Cancer Paternal Grandfather     Review of Systems  Constitutional: Negative for chills and fever.  Gastrointestinal: Positive for abdominal pain. Negative for blood in stool, constipation, diarrhea and nausea (intermittent, chronic).       Gerd controlled  Genitourinary: Negative for dysuria and hematuria.  Neurological: Positive for headaches. Negative for light-headedness.       Objective:   Vitals:   11/30/18 0924  BP: 124/80  Pulse: 84  Resp: 16  Temp: 99.2 F (37.3 C)  SpO2: 97%   BP Readings from Last 3 Encounters:  11/30/18 124/80  10/26/18 110/64  05/19/18 132/78   Wt Readings from Last 3 Encounters:  11/30/18 173 lb (78.5 kg)  10/26/18 176 lb (79.8 kg)  05/19/18 176 lb 3.2 oz (79.9 kg)   Body mass index is 31.64 kg/m.   Physical Exam Constitutional:      General: She is not in acute distress.    Appearance: Normal appearance. She is not ill-appearing.  HENT:     Head: Normocephalic and atraumatic.  Abdominal:     General: There is no distension.     Palpations: Abdomen is soft. There is no mass.     Tenderness: There is abdominal tenderness (Left upper quadrant and mid quadrant through the flank and thoracic spine). There is no right CVA tenderness, left CVA tenderness, guarding or rebound.     Hernia: No hernia is present.  Musculoskeletal:        General: Tenderness (Mild tenderness with palpation from mid thoracic spine to left upper quadrant-pain is a burning type pain) present.  Skin:    General: Skin is warm and dry.     Findings: Lesion  (Sebaceous cyst left upper quadrant that is indurated approximately the size of a grape, tender, no discharge) present. No erythema or rash.  Neurological:     Mental Status: She is alert.            Assessment & Plan:  See Problem List for Assessment and Plan of chronic medical problems.

## 2018-11-30 ENCOUNTER — Other Ambulatory Visit: Payer: Self-pay

## 2018-11-30 ENCOUNTER — Ambulatory Visit (INDEPENDENT_AMBULATORY_CARE_PROVIDER_SITE_OTHER): Payer: 59 | Admitting: Internal Medicine

## 2018-11-30 ENCOUNTER — Encounter: Payer: Self-pay | Admitting: Internal Medicine

## 2018-11-30 VITALS — BP 124/80 | HR 84 | Temp 99.2°F | Resp 16 | Ht 62.0 in | Wt 173.0 lb

## 2018-11-30 DIAGNOSIS — L729 Follicular cyst of the skin and subcutaneous tissue, unspecified: Secondary | ICD-10-CM

## 2018-11-30 DIAGNOSIS — Z23 Encounter for immunization: Secondary | ICD-10-CM

## 2018-11-30 DIAGNOSIS — L089 Local infection of the skin and subcutaneous tissue, unspecified: Secondary | ICD-10-CM | POA: Insufficient documentation

## 2018-11-30 DIAGNOSIS — M792 Neuralgia and neuritis, unspecified: Secondary | ICD-10-CM | POA: Diagnosis not present

## 2018-11-30 MED ORDER — GABAPENTIN 100 MG PO CAPS: 100.0000 mg | ORAL_CAPSULE | Freq: Every day | ORAL | 0 refills | Status: DC

## 2018-11-30 MED ORDER — SULFAMETHOXAZOLE-TRIMETHOPRIM 800-160 MG PO TABS: 1.0000 | ORAL_TABLET | Freq: Two times a day (BID) | ORAL | 0 refills | Status: DC

## 2018-11-30 NOTE — Assessment & Plan Note (Signed)
She does have an infected skin cyst-located in the left upper quadrant, indurated area the size of a grape and tender Start Bactrim twice daily Can apply warm compresses She will call if there is no improvement

## 2018-11-30 NOTE — Patient Instructions (Signed)
Take the antibiotic ( Bactrim) for the infected cyst.  You can apply heat.   Take the gabapentin at night for the burning type pain.  This dose can be adjusted so call if your pain is not controlled or not going away.  This is likely pain from a pinched nerve in your middle back.

## 2018-11-30 NOTE — Assessment & Plan Note (Signed)
Left side from the thoracic region to left upper quadrant has a burning type pain Likely nerve related pain Unlikely to be from the infected skin cyst ?  Thoracic neuralgia No rash to suggest shingles We will start gabapentin at bedtime 100 mg nightly-can titrate up. Asked her to call in a couple of days with an update, sooner if needed Can consider sports medicine referral if needed

## 2018-12-05 ENCOUNTER — Telehealth: Payer: Self-pay

## 2018-12-05 DIAGNOSIS — L089 Local infection of the skin and subcutaneous tissue, unspecified: Secondary | ICD-10-CM

## 2018-12-05 DIAGNOSIS — L729 Follicular cyst of the skin and subcutaneous tissue, unspecified: Secondary | ICD-10-CM

## 2018-12-05 NOTE — Telephone Encounter (Signed)
Copied from [place] [id number]. Topic: General - Other >> Dec 05, 2018  8:29 AM Celene Kras A wrote: Reason for CRM: Pt called and is requesting to have PCP put in referral for dermatologist. Please advise.

## 2018-12-05 NOTE — Telephone Encounter (Signed)
Yes

## 2018-12-05 NOTE — Telephone Encounter (Signed)
?   For the cyst  ?

## 2018-12-07 ENCOUNTER — Encounter: Payer: Self-pay | Admitting: Internal Medicine

## 2019-01-02 ENCOUNTER — Other Ambulatory Visit: Payer: Self-pay

## 2019-01-02 ENCOUNTER — Encounter: Payer: Self-pay | Admitting: Allergy and Immunology

## 2019-01-02 ENCOUNTER — Ambulatory Visit: Payer: 59 | Admitting: Allergy and Immunology

## 2019-01-02 VITALS — BP 116/70 | HR 73 | Temp 97.8°F | Resp 18

## 2019-01-02 DIAGNOSIS — L500 Allergic urticaria: Secondary | ICD-10-CM | POA: Diagnosis not present

## 2019-01-02 DIAGNOSIS — J4530 Mild persistent asthma, uncomplicated: Secondary | ICD-10-CM | POA: Diagnosis not present

## 2019-01-02 DIAGNOSIS — H1013 Acute atopic conjunctivitis, bilateral: Secondary | ICD-10-CM | POA: Diagnosis not present

## 2019-01-02 DIAGNOSIS — J3089 Other allergic rhinitis: Secondary | ICD-10-CM

## 2019-01-02 MED ORDER — TRETINOIN (EMOLLIENT) 0.05 % EX CREA: TOPICAL_CREAM | CUTANEOUS | 1 refills | Status: DC

## 2019-01-02 MED ORDER — PAZEO 0.7 % OP SOLN: 1.0000 [drp] | Freq: Every day | OPHTHALMIC | 2 refills | Status: DC | PRN

## 2019-01-02 NOTE — Assessment & Plan Note (Signed)
   Treatment plan as outlined above for allergic rhinitis.  A prescription has been provided for Pazeo, one drop per eye daily as needed.  A prior authorization will be sent in as she has failed Pataday.  I have also recommended eye lubricant drops (i.e., Natural Tears) as needed.

## 2019-01-02 NOTE — Assessment & Plan Note (Signed)
   Continue appropriate allergen avoidance measures and montelukast 10 mg daily.  To avoid diminishing benefit with daily use (tachyphylaxis) of second generation antihistamine, consider alternating every few months between fexofenadine (Allegra) and levocetirizine (Xyzal).  A prescription has been provided for azelastine nasal spray, 1-2 sprays per nostril 2 times daily as needed. Proper nasal spray technique has been discussed and demonstrated.   Nasal saline spray (i.e., Simply Saline) or nasal saline lavage (i.e., NeilMed) is recommended as needed and prior to medicated nasal sprays.  If allergen avoidance measures and medications fail to adequately relieve symptoms, aeroallergen immunotherapy will be considered.

## 2019-01-02 NOTE — Assessment & Plan Note (Signed)
Currently well controlled.  Continue montelukast 10 mg daily at bedtime and albuterol HFA, 1 to 2 inhalations every 4-6 hours if needed.  Subjective and objective measures of pulmonary function will be followed and the treatment plan will be adjusted accordingly.

## 2019-01-02 NOTE — Assessment & Plan Note (Signed)
Currently quiescent.  Levocetirizine (Xyzal) or fexofenadine (Allegra) as needed.  Per the patient's request, for hyperpigmentation of scarred areas, a prescription will be provided for tretinoin 0.05% cream sparingly to affected areas nightly as needed.

## 2019-01-02 NOTE — Progress Notes (Signed)
Follow-up Note  RE: CAYLI ESCAJEDA MRN: [id number] DOB: 04/13/1970 Date of Office Visit: 01/02/2019  Primary care provider: Pincus Sanes, MD Referring provider: Pincus Sanes, MD  History of present illness: Kathleen Ortiz is a 48 y.o. female with mild persistent asthma, allergic rhinoconjunctivitis, history of recurrent urticaria, and food allergy presenting today for follow-up.  She was previously evaluated in this clinic via telemedicine on August 02, 2018.  She reports that over the past 24 hours she has experienced increased nasal allergy symptoms and ocular pruritus.  She is attempting to control the symptoms with montelukast at bedtimes and DayQuil during the day.  She reports that her asthma is currently well controlled with montelukast 10 mg daily.  She rarely requires albuterol rescue and does not experience limitations in normal daily activities or nocturnal awakenings due to lower respiratory symptoms.  She denies recurrence of urticaria.  However, she requests tretinoin cream because of hyperpigmentation of scarred areas where she had scratched urticarial lesions.  She had been prescribed tretinoin cream in the past by her dermatologist, however states that she no longer sees this dermatologist.  Assessment and plan: Perennial and seasonal allergic rhinitis  Continue appropriate allergen avoidance measures and montelukast 10 mg daily.  To avoid diminishing benefit with daily use (tachyphylaxis) of second generation antihistamine, consider alternating every few months between fexofenadine (Allegra) and levocetirizine (Xyzal).  A prescription has been provided for azelastine nasal spray, 1-2 sprays per nostril 2 times daily as needed. Proper nasal spray technique has been discussed and demonstrated.   Nasal saline spray (i.e., Simply Saline) or nasal saline lavage (i.e., NeilMed) is recommended as needed and prior to medicated nasal sprays.  If allergen avoidance measures  and medications fail to adequately relieve symptoms, aeroallergen immunotherapy will be considered.  Allergic conjunctivitis  Treatment plan as outlined above for allergic rhinitis.  A prescription has been provided for Pazeo, one drop per eye daily as needed.  A prior authorization will be sent in as she has failed Pataday.  I have also recommended eye lubricant drops (i.e., Natural Tears) as needed.  Mild persistent asthma Currently well controlled.  Continue montelukast 10 mg daily at bedtime and albuterol HFA, 1 to 2 inhalations every 4-6 hours if needed.  Subjective and objective measures of pulmonary function will be followed and the treatment plan will be adjusted accordingly.  Recurrent urticaria Currently quiescent.  Levocetirizine (Xyzal) or fexofenadine (Allegra) as needed.  Per the patient's request, for hyperpigmentation of scarred areas, a prescription will be provided for tretinoin 0.05% cream sparingly to affected areas nightly as needed.   Meds ordered this encounter  Medications  . Tretinoin, Facial Wrinkles, (TRETINOIN, EMOLLIENT,) 0.05 % CREA    Sig: Apply sparingly to affected areas nightly as needed.    Dispense:  120 g    Refill:  1  . PAZEO 0.7 % SOLN    Sig: Place 1 drop into both eyes daily as needed.    Dispense:  7.5 mL    Refill:  2    Diagnostics: Spirometry:  Normal with an FEV1 of 89% predicted. This study was performed while the patient was asymptomatic.  Please see scanned spirometry results for details.    Physical examination: Blood pressure 116/70, pulse 73, temperature 97.8 F (36.6 C), temperature source Temporal, resp. rate 18, SpO2 98 %.  General: Alert, interactive, in no acute distress. HEENT: TMs pearly gray, turbinates moderately edematous without discharge, post-pharynx unremarkable. Neck: Supple without lymphadenopathy. Lungs:  Clear to auscultation without wheezing, rhonchi or rales. CV: Normal S1, S2 without murmurs.  Skin: Warm and dry, without lesions or rashes.  The following portions of the patient's history were reviewed and updated as appropriate: allergies, current medications, past family history, past medical history, past social history, past surgical history and problem list.  Allergies as of 01/02/2019      Reactions   Doxycycline Other (See Comments)   headache   Shellfish Allergy    Latex Rash      Medication List       Accurate as of January 02, 2019  9:35 PM. If you have any questions, ask your nurse or doctor.        STOP taking these medications   Olopatadine HCl 0.2 % Soln Replaced by: Pazeo 0.7 % Soln Stopped by: Wellington Hampshire Carter Skyah Hannon, MD   sulfamethoxazole-trimethoprim 800-160 MG tablet Commonly known as: BACTRIM DS Stopped by: Wellington Hampshire Carter Tenisha Fleece, MD     TAKE these medications   albuterol 108 (90 Base) MCG/ACT inhaler Commonly known as: ProAir HFA Inhale 2 puffs into the lungs every 4 (four) hours as needed for wheezing or shortness of breath.   escitalopram 20 MG tablet Commonly known as: LEXAPRO Take 20 mg by mouth daily.   fluticasone 50 MCG/ACT nasal spray Commonly known as: FLONASE Place 2 sprays into both nostrils daily as needed for allergies or rhinitis.   gabapentin 100 MG capsule Commonly known as: NEURONTIN Take 1 capsule (100 mg total) by mouth at bedtime.   levocetirizine 5 MG tablet Commonly known as: XYZAL TAKE 1 TABLET BY MOUTH EVERY DAY IN THE EVENING   montelukast 10 MG tablet Commonly known as: Singulair Take 1 tablet (10 mg total) by mouth at bedtime.   omeprazole 40 MG capsule Commonly known as: PRILOSEC Take 40 mg by mouth daily.   Pazeo 0.7 % Soln Generic drug: Olopatadine HCl Place 1 drop into both eyes daily as needed. Replaces: Olopatadine HCl 0.2 % Soln Started by: Wellington Hampshire Carter Morayma Godown, MD   Tretinoin (Emollient) 0.05 % Crea Apply sparingly to affected areas nightly as needed. Started by: Wellington Hampshire Carter Isom Kochan, MD   Vitamin D  (Cholecalciferol) 25 MCG (1000 UT) Caps Take 2,000 Units by mouth 3 (three) times a week.       Allergies  Allergen Reactions  . Doxycycline Other (See Comments)    headache  . Shellfish Allergy   . Latex Rash   Review of systems: Review of systems negative except as noted in HPI / PMHx or noted below: Constitutional: Negative.  HENT: Negative.   Eyes: Negative.  Respiratory: Negative.   Cardiovascular: Negative.  Gastrointestinal: Negative.  Genitourinary: Negative.  Musculoskeletal: Negative.  Neurological: Negative.  Endo/Heme/Allergies: Negative.  Cutaneous: Negative.  Past Medical History:  Diagnosis Date  . Chronic bronchitis (HCC)   . Eczema   . GERD (gastroesophageal reflux disease)   . Mild persistent asthma 08/02/2018  . Urticaria     Family History  Problem Relation Age of Onset  . Arthritis Mother   . Cancer Mother        Lung & colon  . Hypertension Mother   . Diabetes Maternal Aunt   . Arthritis Maternal Grandmother   . Hypertension Maternal Grandmother   . Heart disease Maternal Grandmother   . Diabetes Maternal Grandmother   . Cancer Paternal Grandfather     Social History   Socioeconomic History  . Marital status: Single    Spouse name: Not on file  .  Number of children: Not on file  . Years of education: Not on file  . Highest education level: Not on file  Occupational History  . Not on file  Social Needs  . Financial resource strain: Not on file  . Food insecurity    Worry: Not on file    Inability: Not on file  . Transportation needs    Medical: Not on file    Non-medical: Not on file  Tobacco Use  . Smoking status: Former Smoker    Packs/day: 0.10    Types: Cigarettes  . Smokeless tobacco: Never Used  Substance and Sexual Activity  . Alcohol use: Yes  . Drug use: No  . Sexual activity: Not on file  Lifestyle  . Physical activity    Days per week: Not on file    Minutes per session: Not on file  . Stress: Not on file   Relationships  . Social Herbalist on phone: Not on file    Gets together: Not on file    Attends religious service: Not on file    Active member of club or organization: Not on file    Attends meetings of clubs or organizations: Not on file    Relationship status: Not on file  . Intimate partner violence    Fear of current or ex partner: Not on file    Emotionally abused: Not on file    Physically abused: Not on file    Forced sexual activity: Not on file  Other Topics Concern  . Not on file  Social History Narrative   No regular exercise       I appreciate the opportunity to take part in [place] care. Please do not hesitate to contact me with questions.  Sincerely,   R. Edgar Frisk, MD

## 2019-01-02 NOTE — Patient Instructions (Addendum)
Perennial and seasonal allergic rhinitis  Continue appropriate allergen avoidance measures and montelukast 10 mg daily.  To avoid diminishing benefit with daily use (tachyphylaxis) of second generation antihistamine, consider alternating every few months between fexofenadine (Allegra) and levocetirizine (Xyzal).  A prescription has been provided for azelastine nasal spray, 1-2 sprays per nostril 2 times daily as needed. Proper nasal spray technique has been discussed and demonstrated.   Nasal saline spray (i.e., Simply Saline) or nasal saline lavage (i.e., NeilMed) is recommended as needed and prior to medicated nasal sprays.  If allergen avoidance measures and medications fail to adequately relieve symptoms, aeroallergen immunotherapy will be considered.  Allergic conjunctivitis  Treatment plan as outlined above for allergic rhinitis.  A prescription has been provided for Pazeo, one drop per eye daily as needed.  A prior authorization will be sent in as she has failed Pataday.  I have also recommended eye lubricant drops (i.e., Natural Tears) as needed.  Mild persistent asthma Currently well controlled.  Continue montelukast 10 mg daily at bedtime and albuterol HFA, 1 to 2 inhalations every 4-6 hours if needed.  Subjective and objective measures of pulmonary function will be followed and the treatment plan will be adjusted accordingly.  Recurrent urticaria Currently quiescent.  Levocetirizine (Xyzal) or fexofenadine (Allegra) as needed.  Per the patient's request, for hyperpigmentation of scarred areas, a prescription will be provided for tretinoin 0.05% cream sparingly to affected areas nightly as needed.   Return in about 6 months (around 07/02/2019), or if symptoms worsen or fail to improve.

## 2019-01-13 ENCOUNTER — Other Ambulatory Visit: Payer: Self-pay

## 2019-01-13 ENCOUNTER — Telehealth: Payer: Self-pay | Admitting: Allergy and Immunology

## 2019-01-13 MED ORDER — TRETINOIN (EMOLLIENT) 0.05 % EX CREA: TOPICAL_CREAM | CUTANEOUS | 1 refills | Status: DC

## 2019-01-13 NOTE — Telephone Encounter (Signed)
Patient said Dr. Verlin Fester sent in a prescription for Tretinoin, to [facility]. She said it will not be covered if sent there. She would like it sent to [facility] on [address].

## 2019-01-13 NOTE — Telephone Encounter (Signed)
Patient called back and I let her know her prescription has been sent to requested pharmacy.

## 2019-01-13 NOTE — Telephone Encounter (Signed)
Prescription sent to requested pharmacy. Patient notified.

## 2019-01-24 ENCOUNTER — Telehealth: Payer: Self-pay | Admitting: *Deleted

## 2019-01-24 NOTE — Telephone Encounter (Signed)
PA has been submitted via Cover My Meds and is currently pending

## 2019-01-24 NOTE — Telephone Encounter (Signed)
Patient states that the pharmacy is waiting on a prior auth for Tretinoin cream. Please call patient when this has been done.

## 2019-01-26 NOTE — Telephone Encounter (Signed)
PA is still pending.  

## 2019-01-31 NOTE — Telephone Encounter (Signed)
Patient made aware that she will need to contact her dermatologist for an alternative medication per Dr. Verlin Fester. Patient verbalized understanding.

## 2019-01-31 NOTE — Telephone Encounter (Signed)
PA for Tretinoin has been denied. Please advise an alternative.

## 2019-01-31 NOTE — Telephone Encounter (Signed)
Tretinoin does not have medication I typically prescribe, it is a medication that dermatologist typically prescribe.  She requested that I refill the tretinoin because she has not seen her dermatologist in a while.  Unfortunately, she will need to discuss alternatives to tretinoin with her dermatologist and received a prescription from her dermatologist.

## 2019-02-24 ENCOUNTER — Other Ambulatory Visit: Payer: Self-pay

## 2019-02-24 DIAGNOSIS — Z20822 Contact with and (suspected) exposure to covid-19: Secondary | ICD-10-CM

## 2019-02-25 ENCOUNTER — Other Ambulatory Visit: Payer: Self-pay | Admitting: Allergy and Immunology

## 2019-02-25 DIAGNOSIS — J3089 Other allergic rhinitis: Secondary | ICD-10-CM

## 2019-02-26 LAB — NOVEL CORONAVIRUS, NAA: SARS-CoV-2, NAA: NOT DETECTED

## 2019-02-27 ENCOUNTER — Telehealth: Payer: Self-pay | Admitting: General Practice

## 2019-02-27 NOTE — Telephone Encounter (Signed)
Negative COVID results given. Patient results "NOT Detected." Caller expressed understanding. ° °

## 2019-05-08 ENCOUNTER — Ambulatory Visit: Payer: Self-pay

## 2019-05-08 ENCOUNTER — Telehealth: Payer: Self-pay

## 2019-05-08 ENCOUNTER — Ambulatory Visit (INDEPENDENT_AMBULATORY_CARE_PROVIDER_SITE_OTHER): Payer: No Typology Code available for payment source

## 2019-05-08 ENCOUNTER — Ambulatory Visit (INDEPENDENT_AMBULATORY_CARE_PROVIDER_SITE_OTHER): Payer: No Typology Code available for payment source | Admitting: Family Medicine

## 2019-05-08 ENCOUNTER — Encounter: Payer: Self-pay | Admitting: Family Medicine

## 2019-05-08 ENCOUNTER — Other Ambulatory Visit: Payer: Self-pay

## 2019-05-08 ENCOUNTER — Ambulatory Visit (INDEPENDENT_AMBULATORY_CARE_PROVIDER_SITE_OTHER): Payer: No Typology Code available for payment source | Admitting: Internal Medicine

## 2019-05-08 ENCOUNTER — Encounter: Payer: Self-pay | Admitting: Internal Medicine

## 2019-05-08 VITALS — BP 134/82 | HR 91 | Temp 98.9°F | Ht 62.0 in | Wt 168.2 lb

## 2019-05-08 DIAGNOSIS — M7122 Synovial cyst of popliteal space [Baker], left knee: Secondary | ICD-10-CM | POA: Diagnosis not present

## 2019-05-08 DIAGNOSIS — M25562 Pain in left knee: Secondary | ICD-10-CM | POA: Insufficient documentation

## 2019-05-08 NOTE — Progress Notes (Signed)
Subjective:    Patient ID: Kathleen Ortiz, female    DOB: 1971/02/01, 49 y.o.   MRN: [id number]  HPI The patient is here for an acute visit.   On Saturday night she was squatting all the way down and when she raised up she had instant pain in her left knee and the left leg.  The pain was in her posterior left upper leg and knee.  The pain subsided and she showered and got ready for bed.  When she was laying in bed her knee was hurting and it felt like the pain was inside the knee.  The whole knee hurt and was swollen.  The next morning her knee continued to be painful and swollen.  She has pain throughout the entire posterior left leg and anterior thigh.  Most of the pain is focused in the knee, especially be medial aspect of the knee.  She does have pain with flexion and extension.  She has slightly decreased range of motion.  Her left big toe continues to be numb, but this is been ongoing for about 6 months.    Medications and allergies reviewed with patient and updated if appropriate.  Patient Active Problem List   Diagnosis Date Noted  . Infected cyst of skin 11/30/2018  . Neuralgia of flank, left 11/30/2018  . Perimenopausal symptoms 10/26/2018  . Obesity 10/26/2018  . Mild persistent asthma 08/02/2018  . Fatty liver 12/11/2017  . Allergic conjunctivitis 07/26/2017  . Gastroesophageal reflux disease 08/03/2016  . At risk of diabetes mellitus 08/03/2016  . Recurrent urticaria 05/11/2016  . Food allergy 05/11/2016  . Rash and nonspecific skin eruption 03/17/2016  . Right shoulder pain 08/02/2015  . Perennial and seasonal allergic rhinitis 03/20/2015  . Vitamin D deficiency 03/20/2015  . Eczema 03/20/2015    Current Outpatient Medications on File Prior to Visit  Medication Sig Dispense Refill  . albuterol (PROAIR HFA) 108 (90 Base) MCG/ACT inhaler Inhale 2 puffs into the lungs every 4 (four) hours as needed for wheezing or shortness of breath. 3 Inhaler 0  .  escitalopram (LEXAPRO) 20 MG tablet Take 20 mg by mouth daily.    . fluticasone (FLONASE) 50 MCG/ACT nasal spray Place 2 sprays into both nostrils daily as needed for allergies or rhinitis. 16 g 5  . gabapentin (NEURONTIN) 100 MG capsule Take 1 capsule (100 mg total) by mouth at bedtime. 30 capsule 0  . levocetirizine (XYZAL) 5 MG tablet TAKE 1 TABLET BY MOUTH EVERY DAY IN THE EVENING 30 tablet 5  . montelukast (SINGULAIR) 10 MG tablet TAKE 1 TABLET BY MOUTH AT  BEDTIME 90 tablet 0  . omeprazole (PRILOSEC) 40 MG capsule Take 40 mg by mouth daily.    Marland Kitchen PAZEO 0.7 % SOLN Place 1 drop into both eyes daily as needed. 7.5 mL 2  . Tretinoin, Facial Wrinkles, (TRETINOIN, EMOLLIENT,) 0.05 % CREA Apply sparingly to affected areas nightly as needed. 120 g 1  . Vitamin D, Cholecalciferol, 1000 UNITS CAPS Take 2,000 Units by mouth 3 (three) times a week.     No current facility-administered medications on file prior to visit.    Past Medical History:  Diagnosis Date  . Chronic bronchitis (HCC)   . Eczema   . GERD (gastroesophageal reflux disease)   . Mild persistent asthma 08/02/2018  . Urticaria     Past Surgical History:  Procedure Laterality Date  . TYMPANOSTOMY TUBE PLACEMENT      Social History  Socioeconomic History  . Marital status: Single    Spouse name: Not on file  . Number of children: Not on file  . Years of education: Not on file  . Highest education level: Not on file  Occupational History  . Not on file  Tobacco Use  . Smoking status: Former Smoker    Packs/day: 0.10    Types: Cigarettes  . Smokeless tobacco: Never Used  Substance and Sexual Activity  . Alcohol use: Yes  . Drug use: No  . Sexual activity: Not on file  Other Topics Concern  . Not on file  Social History Narrative   No regular exercise      Social Determinants of Health   Financial Resource Strain:   . Difficulty of Paying Living Expenses: Not on file  Food Insecurity:   . Worried About  Charity fundraiser in the Last Year: Not on file  . Ran Out of Food in the Last Year: Not on file  Transportation Needs:   . Lack of Transportation (Medical): Not on file  . Lack of Transportation (Non-Medical): Not on file  Physical Activity:   . Days of Exercise per Week: Not on file  . Minutes of Exercise per Session: Not on file  Stress:   . Feeling of Stress : Not on file  Social Connections:   . Frequency of Communication with Friends and Family: Not on file  . Frequency of Social Gatherings with Friends and Family: Not on file  . Attends Religious Services: Not on file  . Active Member of Clubs or Organizations: Not on file  . Attends Archivist Meetings: Not on file  . Marital Status: Not on file    Family History  Problem Relation Age of Onset  . Arthritis Mother   . Cancer Mother        Lung & colon  . Hypertension Mother   . Diabetes Maternal Aunt   . Arthritis Maternal Grandmother   . Hypertension Maternal Grandmother   . Heart disease Maternal Grandmother   . Diabetes Maternal Grandmother   . Cancer Paternal Grandfather     Review of Systems  Constitutional: Negative for chills and fever.  Musculoskeletal: Positive for arthralgias and joint swelling.  Neurological: Positive for numbness (left first thumb numb x 6 months).  Psychiatric/Behavioral: Positive for sleep disturbance.       Objective:   Vitals:   05/08/19 0915  BP: 134/82  Pulse: 91  Resp: 16  Temp: 98.9 F (37.2 C)  SpO2: 99%   BP Readings from Last 3 Encounters:  05/08/19 134/82  01/02/19 116/70  11/30/18 124/80   Wt Readings from Last 3 Encounters:  05/08/19 167 lb (75.8 kg)  11/30/18 173 lb (78.5 kg)  10/26/18 176 lb (79.8 kg)   Body mass index is 30.54 kg/m.   Physical Exam    A left knee and leg exam was performed.   SKIN: intact, no bruising or discoloration SWELLING: mild medial aspect EFFUSION: yes, mild on the medial aspect WARMTH: no warmth    TENDERNESS: moderate tenderness on medial and posterior aspect of knee  ROM: Slightly decreased extension, decreased flexion  GAIT: walks with a limp - unable to fully bear weight on left leg  NEUROLOGICAL EXAM: normal sensation  CALF TENDERNESS: Positive       Assessment & Plan:    See Problem List for Assessment and Plan of chronic medical problems.    This visit occurred during the SARS-CoV-2  public health emergency.  Safety protocols were in place, including screening questions prior to the visit, additional usage of staff PPE, and extensive cleaning of exam room while observing appropriate contact time as indicated for disinfecting solutions.
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
## 2019-05-08 NOTE — Patient Instructions (Signed)
Thank you for coming in today. Recheck in 2-4 weeks.  Call or go to the ER if you develop a large red swollen joint with extreme pain or oozing puss.  Take easy for a few days.    Baker Cyst  A Baker cyst, also called a popliteal cyst, is a growth that forms at the back of the knee. The cyst forms when the fluid-filled sac (bursa) that cushions the knee joint becomes enlarged. What are the causes? In most cases, a Baker cyst results from another knee problem that causes swelling inside the knee. This makes the fluid inside the knee joint (synovial fluid) flow into the bursa behind the knee, causing the bursa to enlarge. What increases the risk? You may be more likely to develop a Baker cyst if you already have a knee problem, such as:  A tear in cartilage that cushions the knee joint (meniscal tear).  A tear in the tissues that connect the bones of the knee joint (ligament tear).  Knee swelling from osteoarthritis, rheumatoid arthritis, or gout. What are the signs or symptoms? The main symptom of this condition is a lump behind the knee. This may be the only symptom of the condition. The lump may be painful, especially when the knee is straightened. If the lump is painful, the pain may come and go. The knee may also be stiff. Symptoms may quickly get more severe if the cyst breaks open (ruptures). If the cyst ruptures, you may feel the following in your knee and calf:  Sudden or worsening pain.  Swelling.  Bruising.  Redness in the calf. A Baker cyst does not always cause symptoms. How is this diagnosed? This condition may be diagnosed based on your symptoms and medical history. Your health care provider will also do a physical exam. This may include:  Feeling the cyst to check whether it is tender.  Checking your knee for signs of another knee condition that causes swelling. You may have imaging tests, such as:  X-rays.  MRI.  Ultrasound. How is this treated? A Baker  cyst that is not painful may go away without treatment. If the cyst gets large or painful, it will likely get better if the underlying knee problem is treated. If needed, treatment for a Baker cyst may include:  Resting.  Keeping weight off of the knee. This means not leaning on the knee to support your body weight.  Taking NSAIDs, such as ibuprofen, to reduce pain and swelling.  Having a procedure to drain the fluid from the cyst with a needle (aspiration). You may also get an injection of a medicine that reduces swelling (steroid).  Having surgery. This may be needed if other treatments do not work. This usually involves correcting knee damage and removing the cyst. Follow these instructions at home:  Activity  Rest as told by your health care provider.  Avoid activities that make pain or swelling worse.  Return to your normal activities as told by your health care provider. Ask your health care provider what activities are safe for you.  Do not use the injured limb to support your body weight until your health care provider says that you can. Use crutches as told by your health care provider. General instructions  Take over-the-counter and prescription medicines only as told by your health care provider.  Keep all follow-up visits as told by your health care provider. This is important. Contact a health care provider if:  You have knee pain, stiffness, or  swelling that does not get better. Get help right away if:  You have sudden or worsening pain and swelling in your calf area. Summary  A Baker cyst, also called a popliteal cyst, is a growth that forms at the back of the knee.  In most cases, a Baker cyst results from another knee problem that causes swelling inside the knee.  A Baker cyst that is not painful may go away without treatment.  If needed, treatment for a Baker cyst may include resting, keeping weight off of the knee, medicines, or draining fluid from the  cyst.  Surgery may be needed if other treatments are not effective. This information is not intended to replace advice given to you by your health care provider. Make sure you discuss any questions you have with your health care provider. Document Revised: 08/05/2018 Document Reviewed: 08/05/2018 Elsevier Patient Education  [place].

## 2019-05-08 NOTE — Assessment & Plan Note (Signed)
Left knee pain, especially in the medial and posterior aspect of the knee that started after squatting and standing up Her entire posterior leg and anterior thigh her, but the knee seems to be the major issue Concerned that she possibly tore something Reassured her that this is not a blood clot Note given for work so that she can see sports medicine today-we will let them further evaluate and see what treatment is necessary

## 2019-05-08 NOTE — Patient Instructions (Signed)
You will see sports medicine downstairs today for further evaluation.

## 2019-05-08 NOTE — Telephone Encounter (Signed)
Patient would like the letters that were written today to be released to her mychart. Would like a call back when done

## 2019-05-09 NOTE — Progress Notes (Signed)
Xray knee shows mild arthritis.

## 2019-05-17 ENCOUNTER — Other Ambulatory Visit: Payer: Self-pay | Admitting: Allergy and Immunology

## 2019-05-17 DIAGNOSIS — J3089 Other allergic rhinitis: Secondary | ICD-10-CM

## 2019-05-18 NOTE — Telephone Encounter (Signed)
Courtesy

## 2019-05-24 ENCOUNTER — Ambulatory Visit (INDEPENDENT_AMBULATORY_CARE_PROVIDER_SITE_OTHER): Payer: No Typology Code available for payment source | Admitting: Family Medicine

## 2019-05-24 ENCOUNTER — Encounter: Payer: Self-pay | Admitting: Family Medicine

## 2019-05-24 ENCOUNTER — Ambulatory Visit: Payer: Self-pay

## 2019-05-24 ENCOUNTER — Other Ambulatory Visit: Payer: Self-pay

## 2019-05-24 VITALS — BP 128/86 | HR 78 | Ht 62.0 in | Wt 171.0 lb

## 2019-05-24 DIAGNOSIS — M7122 Synovial cyst of popliteal space [Baker], left knee: Secondary | ICD-10-CM | POA: Diagnosis not present

## 2019-05-24 DIAGNOSIS — M25562 Pain in left knee: Secondary | ICD-10-CM

## 2019-05-24 NOTE — Patient Instructions (Signed)
Thank you for coming in today. Try using over the counter voltaren gel on the knee up to 4x daily.  Use a compression sleeve on the knee.  Body Helix full Knee is a good one.  If not doing well please let me know.  I can do the draining and injection of the baker cyst if needed.    Baker Cyst  A Baker cyst, also called a popliteal cyst, is a growth that forms at the back of the knee. The cyst forms when the fluid-filled sac (bursa) that cushions the knee joint becomes enlarged. What are the causes? In most cases, a Baker cyst results from another knee problem that causes swelling inside the knee. This makes the fluid inside the knee joint (synovial fluid) flow into the bursa behind the knee, causing the bursa to enlarge. What increases the risk? You may be more likely to develop a Baker cyst if you already have a knee problem, such as:  A tear in cartilage that cushions the knee joint (meniscal tear).  A tear in the tissues that connect the bones of the knee joint (ligament tear).  Knee swelling from osteoarthritis, rheumatoid arthritis, or gout. What are the signs or symptoms? The main symptom of this condition is a lump behind the knee. This may be the only symptom of the condition. The lump may be painful, especially when the knee is straightened. If the lump is painful, the pain may come and go. The knee may also be stiff. Symptoms may quickly get more severe if the cyst breaks open (ruptures). If the cyst ruptures, you may feel the following in your knee and calf:  Sudden or worsening pain.  Swelling.  Bruising.  Redness in the calf. A Baker cyst does not always cause symptoms. How is this diagnosed? This condition may be diagnosed based on your symptoms and medical history. Your health care provider will also do a physical exam. This may include:  Feeling the cyst to check whether it is tender.  Checking your knee for signs of another knee condition that causes swelling.  You may have imaging tests, such as:  X-rays.  MRI.  Ultrasound. How is this treated? A Baker cyst that is not painful may go away without treatment. If the cyst gets large or painful, it will likely get better if the underlying knee problem is treated. If needed, treatment for a Baker cyst may include:  Resting.  Keeping weight off of the knee. This means not leaning on the knee to support your body weight.  Taking NSAIDs, such as ibuprofen, to reduce pain and swelling.  Having a procedure to drain the fluid from the cyst with a needle (aspiration). You may also get an injection of a medicine that reduces swelling (steroid).  Having surgery. This may be needed if other treatments do not work. This usually involves correcting knee damage and removing the cyst. Follow these instructions at home:  Activity  Rest as told by your health care provider.  Avoid activities that make pain or swelling worse.  Return to your normal activities as told by your health care provider. Ask your health care provider what activities are safe for you.  Do not use the injured limb to support your body weight until your health care provider says that you can. Use crutches as told by your health care provider. General instructions  Take over-the-counter and prescription medicines only as told by your health care provider.  Keep all follow-up visits as  told by your health care provider. This is important. Contact a health care provider if:  You have knee pain, stiffness, or swelling that does not get better. Get help right away if:  You have sudden or worsening pain and swelling in your calf area. Summary  A Baker cyst, also called a popliteal cyst, is a growth that forms at the back of the knee.  In most cases, a Baker cyst results from another knee problem that causes swelling inside the knee.  A Baker cyst that is not painful may go away without treatment.  If needed, treatment for a  Baker cyst may include resting, keeping weight off of the knee, medicines, or draining fluid from the cyst.  Surgery may be needed if other treatments are not effective. This information is not intended to replace advice given to you by your health care provider. Make sure you discuss any questions you have with your health care provider. Document Revised: 08/05/2018 Document Reviewed: 08/05/2018 Elsevier Patient Education  [place].

## 2019-05-24 NOTE — Progress Notes (Signed)
   I, Kathleen Ortiz, LAT, ATC, am serving as scribe for Dr. Clementeen Graham.  [name] is a 49 y.o. female who presents to [facility] today for L knee pain and swelling since 05/06/19 when she noticed pain after standing up from a deep squat position.  She was last seen by Dr. Denyse Amass on 05/08/19 and had a L knee injection but declined a L knee aspiration.  Since her last visit, pt reports hat her pain has improved since her last visit but notes that the swelling has not improved.  She notes increased swelling and fatigue in her L knee and leg toward the end of the day.  Overall she think she is a lot better.  She is not using any compression or Voltaren gel.   Pertinent review of systems: No fevers or chills.  Relevant historical information: History asthma   Exam:  BP 128/86 (BP Location: Left Arm, Patient Position: Sitting, Cuff Size: Normal)   Pulse 78   Ht 5\' 2"  (1.575 m)   Wt 171 lb (77.6 kg)   LMP 05/07/2019   SpO2 98%   BMI 31.28 kg/m  General: Well Developed, well nourished, and in no acute distress.   MSK: Left knee: Continued slight swelling posterior medial knee.  No effusion.  Normal motion.  Nontender.     Assessment and Plan: 49 y.o. female with left knee Baker's cyst.  Significant improvement following steroid injection at last visit 2 weeks ago.  Patient has some swelling and discomfort towards the end of the day.  Discussed some pragmatic simple approaches to managing this.  Recommend compressive knee sleeve and trial of Voltaren gel.  If not sufficient neck step would be aspiration injection of Baker's cyst.  Discussed this with patient who expresses understanding and agreement.  Recheck as needed   PDMP not reviewed this encounter. Orders Placed This Encounter  Procedures  . 57 LIMITED JOINT SPACE STRUCTURES LOW LEFT(NO LINKED CHARGES)    Order Specific Question:   Reason for Exam (SYMPTOM  OR DIAGNOSIS REQUIRED)    Answer:   L  knee pain    Order Specific Question:   Preferred imaging location?    Answer:   [facility]   No orders of the defined types were placed in this encounter.    Discussed warning signs or symptoms. Please see discharge instructions. Patient expresses understanding.   The above documentation has been reviewed and is accurate and complete US

## 2019-07-17 ENCOUNTER — Telehealth: Payer: Self-pay | Admitting: Allergy and Immunology

## 2019-07-17 DIAGNOSIS — J4530 Mild persistent asthma, uncomplicated: Secondary | ICD-10-CM

## 2019-07-17 DIAGNOSIS — J3089 Other allergic rhinitis: Secondary | ICD-10-CM

## 2019-07-17 MED ORDER — ALBUTEROL SULFATE HFA 108 (90 BASE) MCG/ACT IN AERS: 2.0000 | INHALATION_SPRAY | RESPIRATORY_TRACT | 0 refills | Status: AC | PRN

## 2019-07-17 MED ORDER — LEVOCETIRIZINE DIHYDROCHLORIDE 5 MG PO TABS: ORAL_TABLET | ORAL | 0 refills | Status: DC

## 2019-07-17 MED ORDER — FLUTICASONE PROPIONATE 50 MCG/ACT NA SUSP: 2.0000 | Freq: Every day | NASAL | 0 refills | Status: DC | PRN

## 2019-07-17 NOTE — Telephone Encounter (Signed)
Courtesy refills have been sent in. Called patient and advised. Patient verbalized understanding.  

## 2019-07-17 NOTE — Telephone Encounter (Signed)
Patient called and needs some refills Flonase, Xyzal, Proair inhaler to [address]. And made appointment with dr gallagher on April 29. [phone]

## 2019-08-03 ENCOUNTER — Ambulatory Visit (INDEPENDENT_AMBULATORY_CARE_PROVIDER_SITE_OTHER): Payer: No Typology Code available for payment source | Admitting: Allergy & Immunology

## 2019-08-03 ENCOUNTER — Other Ambulatory Visit: Payer: Self-pay

## 2019-08-03 ENCOUNTER — Encounter: Payer: Self-pay | Admitting: Allergy & Immunology

## 2019-08-03 VITALS — BP 118/86 | HR 75 | Temp 98.4°F | Resp 18 | Ht 62.0 in

## 2019-08-03 DIAGNOSIS — T7800XD Anaphylactic reaction due to unspecified food, subsequent encounter: Secondary | ICD-10-CM | POA: Diagnosis not present

## 2019-08-03 DIAGNOSIS — J4530 Mild persistent asthma, uncomplicated: Secondary | ICD-10-CM

## 2019-08-03 DIAGNOSIS — J3089 Other allergic rhinitis: Secondary | ICD-10-CM | POA: Diagnosis not present

## 2019-08-03 DIAGNOSIS — L500 Allergic urticaria: Secondary | ICD-10-CM

## 2019-08-03 DIAGNOSIS — J0100 Acute maxillary sinusitis, unspecified: Secondary | ICD-10-CM

## 2019-08-03 MED ORDER — DEXAMETHASONE 4 MG PO TABS: 16.0000 mg | ORAL_TABLET | Freq: Once | ORAL | 0 refills | Status: AC

## 2019-08-03 NOTE — Progress Notes (Signed)
FOLLOW UP  Date of Service/Encounter:  08/03/19   Assessment:   Mild persistent asthma, unspecified whether complicated   Perennial and seasonal allergic rhinitis (grass, tree, molds, cat, cockroach, dust mite)  Recurrent urticaria - improved  Anaphylactic shock due to food (shellfish)   Acute non-recurrent maxillary sinusitis - treating with steroids first  Plan/Recommendations:   1. Mild persistent asthma - Lung testing looks excellent today. - Continue with albuterol two puffs every 4-6 hours as needed.   2. Perennial and seasonal allergic rhinitis - Continue with an OTC antihistamine daily as needed. - Continue with Astelin as needed.  - ADD ON DECADRON 16mg  once (this lasts for 3-4 days in your system). - Call us if this is not helping.   3. Recurrent urticaria - Continue with the daily antihistamine, as above.   4. Anaphylactic shock due to food - Continue to avoid shellfish.   5. Return in about 6 months (around 02/02/2020). This can be an in-person, a virtual Webex or a telephone follow up visit.   Subjective:   Kathleen Ortiz is a 49 y.o. female presenting today for follow up of  Chief Complaint  Patient presents with  . Allergic Rhinitis     Kathleen Ortiz has a history of the following: Patient Active Problem List   Diagnosis Date Noted  . Baker's cyst of knee, left 05/08/2019  . Left knee pain 05/08/2019  . Infected cyst of skin 11/30/2018  . Neuralgia of flank, left 11/30/2018  . Perimenopausal symptoms 10/26/2018  . Obesity 10/26/2018  . Mild persistent asthma 08/02/2018  . Fatty liver 12/11/2017  . Allergic conjunctivitis 07/26/2017  . Gastroesophageal reflux disease 08/03/2016  . At risk of diabetes mellitus 08/03/2016  . Recurrent urticaria 05/11/2016  . Food allergy 05/11/2016  . Rash and nonspecific skin eruption 03/17/2016  . Right shoulder pain 08/02/2015  . Perennial and seasonal allergic rhinitis 03/20/2015  . Vitamin D  deficiency 03/20/2015  . Eczema 03/20/2015    History obtained from: chart review and patient.  Kathleen Ortiz is a 49 y.o. female presenting for a follow up visit. She is followed by Dr. Verlin Fester. She was last seen in September 2020.  She has a history of mild persistent asthma as well as perennial and seasonal allergic rhinitis.  She was continued on montelukast 10 mg daily as well as an antihistamine.  She also was continued on Astelin.  Her asthma is under good control with Singulair 10 mg daily as well as albuterol as needed.  She also has recurrent urticaria.  She has a history of a shellfish allergy as well.  Since last visit, she has done well.  She overall does not like to take her medications and will restart taking them when she has problems.  This is the case again today.  Asthma/Respiratory Symptom History: She is not using anything on a routine basis for asthma.  She did start her Singulair earlier this week.  She has been using his albuterol a few times.  She has not been to the emergency room. Patient to score is 20, indicating excellent asthma control.  She has not been to the emergency room.  She has not been using any prednisone.  Allergic Rhinitis Symptom History: She is not using her Singulair aside from repeat starting it today.  She is not using her nasal spray at all.  She does report bilateral ear fullness, right greater than left.  She also reports some maxillary sinus tenderness.  She  has not had any purulent discharge.  She does not want any antibiotics.  She also does not seem excited about a course of prednisone.  Food Allergy Symptom History: She continues to avoid shellfish.  She does need a new EpiPen.  Otherwise, there have been no changes to her past medical history, surgical history, family history, or social history.    Review of Systems  Constitutional: Negative.  Negative for chills, fever, malaise/fatigue and weight loss.  HENT: Negative.  Negative for  congestion, ear discharge, ear pain, sinus pain and sore throat.   Eyes: Negative for pain, discharge and redness.  Respiratory: Negative for cough, sputum production, shortness of breath and wheezing.   Cardiovascular: Negative.  Negative for chest pain and palpitations.  Gastrointestinal: Negative for abdominal pain, constipation, diarrhea, heartburn, nausea and vomiting.  Skin: Negative.  Negative for itching and rash.  Neurological: Negative for dizziness and headaches.  Endo/Heme/Allergies: Negative for environmental allergies. Does not bruise/bleed easily.       Objective:   Blood pressure 118/86, pulse 75, temperature 98.4 F (36.9 C), temperature source Temporal, resp. rate 18, height 5\' 2"  (1.575 m), SpO2 98 %. Body mass index is 31.28 kg/m.   Physical Exam:  Physical Exam  Constitutional: She appears well-developed.  HENT:  Head: Normocephalic and atraumatic.  Right Ear: External ear and ear canal normal. No drainage, swelling or tenderness. Tympanic membrane is bulging. Tympanic membrane is not injected, not scarred, not erythematous and not retracted.  Left Ear: External ear and ear canal normal. No drainage, swelling or tenderness. Tympanic membrane is bulging. Tympanic membrane is not injected, not scarred, not erythematous and not retracted.  Nose: No mucosal edema, rhinorrhea, nasal deformity or septal deviation. No epistaxis. Right sinus exhibits maxillary sinus tenderness. Right sinus exhibits no frontal sinus tenderness. Left sinus exhibits maxillary sinus tenderness. Left sinus exhibits no frontal sinus tenderness.  Mouth/Throat: Uvula is midline and oropharynx is clear and moist. Mucous membranes are not pale and not dry.  Neither TM is erythematous at all.   Eyes: Pupils are equal, round, and reactive to light. Conjunctivae and EOM are normal. Right eye exhibits no chemosis and no discharge. Left eye exhibits no chemosis and no discharge. Right conjunctiva is not  injected. Left conjunctiva is not injected.  Cardiovascular: Normal rate, regular rhythm and normal heart sounds.  Respiratory: Effort normal and breath sounds normal. No accessory muscle usage. No tachypnea. No respiratory distress. She has no wheezes. She has no rhonchi. She has no rales. She exhibits no tenderness.  Lymphadenopathy:       Head (right side): No submandibular, no tonsillar and no occipital adenopathy present.       Head (left side): No submandibular, no tonsillar and no occipital adenopathy present.    She has no cervical adenopathy.  Neurological: She is alert.  Skin: No abrasion, no petechiae and no rash noted. Rash is not papular, not vesicular and not urticarial. No erythema. No pallor.  Psychiatric: She has a normal mood and affect.     Diagnostic studies:    Spirometry: Normal FEV1, FVC, and FEV1/FVC ratio. There is no scooping suggestive of obstructive disease.        , MD  [facility]

## 2019-08-03 NOTE — Patient Instructions (Signed)
1. Mild persistent asthma - Lung testing looks excellent today. - Continue with albuterol two puffs every 4-6 hours as needed.   2. Perennial and seasonal allergic rhinitis - Continue with an OTC antihistamine daily as needed. - Continue with Astelin as needed.  - ADD ON DECADRON 16mg  once (this lasts for 3-4 days in your system). - Call if this is not helping.   3. Recurrent urticaria - Continue with the daily antihistamine, as above.   4. Anaphylactic shock due to food - Continue to avoid shellfish.   5. Return in about 6 months (around 02/02/2020). This can be an in-person, a virtual Webex or a telephone follow up visit.  HAPPY BELATED BIRTHDAY!   Please inform 02/04/2020 of any Emergency Department visits, hospitalizations, or changes in symptoms. Call us before going to the ED for breathing or allergy symptoms since we might be able to fit you in for a sick visit. Feel free to contact us anytime with any questions, problems, or concerns.  It was a pleasure to meet you today!  Websites that have reliable patient information: 1. American Academy of Asthma, Allergy, and Immunology: www.aaaai.org 2. Food Allergy Research and Education (FARE): foodallergy.org 3. Mothers of Asthmatics: http://www.asthmacommunitynetwork.org 4. American College of Allergy, Asthma, and Immunology: www.acaai.org   COVID-19 Vaccine Information can be found at: [place] For questions related to vaccine distribution or appointments, please email [email or web address] or call [phone].     "Like" [phone] on Facebook and Instagram for our latest updates!       HAPPY SPRING!  Make sure you are registered to vote! If you have moved or changed any of your contact information, you will need to get this updated before voting!  In some cases, you MAY be able to register to vote online:  [place]

## 2019-11-22 NOTE — Progress Notes (Signed)
Subjective:    Patient ID: Kathleen Ortiz, female    DOB: [birth year], 49 y.o.   MRN: [id number]  HPI The patient is here for an acute visit.   Trouble hearing, ear pain:  She has hearing loss in the right ear for a while - maybe a year.  One week ago she started having sinus issues - they were clogged and her throat, ears itched.  Her sinus does not feel it is draining.  She started xyzal and her symptoms have gotten a little better.    She takes the singulair as needed - she does not like the way it makes her feel.  She uses the flonase daily.     Medications and allergies reviewed with patient and updated if appropriate.  Patient Active Problem List   Diagnosis Date Noted  . Baker's cyst of knee, left 05/08/2019  . Left knee pain 05/08/2019  . Infected cyst of skin 11/30/2018  . Neuralgia of flank, left 11/30/2018  . Perimenopausal symptoms 10/26/2018  . Obesity 10/26/2018  . Mild persistent asthma 08/02/2018  . Fatty liver 12/11/2017  . Allergic conjunctivitis 07/26/2017  . Gastroesophageal reflux disease 08/03/2016  . At risk of diabetes mellitus 08/03/2016  . Recurrent urticaria 05/11/2016  . Food allergy 05/11/2016  . Rash and nonspecific skin eruption 03/17/2016  . Right shoulder pain 08/02/2015  . Perennial and seasonal allergic rhinitis 03/20/2015  . Vitamin D deficiency 03/20/2015  . Eczema 03/20/2015    Current Outpatient Medications on File Prior to Visit  Medication Sig Dispense Refill  . albuterol (PROAIR HFA) 108 (90 Base) MCG/ACT inhaler Inhale 2 puffs into the lungs every 4 (four) hours as needed for wheezing or shortness of breath. 18 g 0  . escitalopram (LEXAPRO) 20 MG tablet Take 20 mg by mouth daily.    . fluticasone (FLONASE) 50 MCG/ACT nasal spray Place 2 sprays into both nostrils daily as needed for allergies or rhinitis. 16 g 0  . gabapentin (NEURONTIN) 100 MG capsule Take 1 capsule (100 mg total) by mouth at bedtime. 30 capsule 0  .  levocetirizine (XYZAL) 5 MG tablet TAKE 1 TABLET BY MOUTH EVERY DAY IN THE EVENING 30 tablet 0  . montelukast (SINGULAIR) 10 MG tablet TAKE 1 TABLET BY MOUTH AT  BEDTIME 30 tablet 0  . omeprazole (PRILOSEC) 40 MG capsule Take 40 mg by mouth daily.    Marland Kitchen PAZEO 0.7 % SOLN Place 1 drop into both eyes daily as needed. 7.5 mL 2  . sertraline (ZOLOFT) 50 MG tablet sertraline 50 mg tablet  TAKE 1 (ONE) TABLET DAILY    . Tretinoin, Facial Wrinkles, (TRETINOIN, EMOLLIENT,) 0.05 % CREA Apply sparingly to affected areas nightly as needed. 120 g 1  . Vitamin D, Cholecalciferol, 1000 UNITS CAPS Take 2,000 Units by mouth 3 (three) times a week.    . valACYclovir (VALTREX) 500 MG tablet Take by mouth.     No current facility-administered medications on file prior to visit.    Past Medical History:  Diagnosis Date  . Chronic bronchitis (HCC)   . Eczema   . GERD (gastroesophageal reflux disease)   . Mild persistent asthma 08/02/2018  . Urticaria     Past Surgical History:  Procedure Laterality Date  . TYMPANOSTOMY TUBE PLACEMENT      Social History   Socioeconomic History  . Marital status: Single    Spouse name: Not on file  . Number of children: Not on file  . Years  of education: Not on file  . Highest education level: Not on file  Occupational History  . Not on file  Tobacco Use  . Smoking status: Former Smoker    Packs/day: 0.10    Types: Cigarettes  . Smokeless tobacco: Never Used  Vaping Use  . Vaping Use: Never used  Substance and Sexual Activity  . Alcohol use: Yes  . Drug use: No  . Sexual activity: Not on file  Other Topics Concern  . Not on file  Social History Narrative   No regular exercise      Social Determinants of Health   Financial Resource Strain:   . Difficulty of Paying Living Expenses: Not on file  Food Insecurity:   . Worried About Programme researcher, broadcasting/film/video in the Last Year: Not on file  . Ran Out of Food in the Last Year: Not on file  Transportation Needs:    . Lack of Transportation (Medical): Not on file  . Lack of Transportation (Non-Medical): Not on file  Physical Activity:   . Days of Exercise per Week: Not on file  . Minutes of Exercise per Session: Not on file  Stress:   . Feeling of Stress : Not on file  Social Connections:   . Frequency of Communication with Friends and Family: Not on file  . Frequency of Social Gatherings with Friends and Family: Not on file  . Attends Religious Services: Not on file  . Active Member of Clubs or Organizations: Not on file  . Attends Banker Meetings: Not on file  . Marital Status: Not on file    Family History  Problem Relation Age of Onset  . Arthritis Mother   . Cancer Mother        Lung & colon  . Hypertension Mother   . Diabetes Maternal Aunt   . Arthritis Maternal Grandmother   . Hypertension Maternal Grandmother   . Heart disease Maternal Grandmother   . Diabetes Maternal Grandmother   . Cancer Paternal Grandfather     Review of Systems  Constitutional: Negative for chills and fever.  HENT: Positive for congestion, ear pain (right - when she tried to pop it), postnasal drip, sinus pressure and sneezing. Negative for hearing loss (right ear) and sore throat.   Respiratory: Negative for cough, shortness of breath and wheezing.   Neurological: Positive for headaches. Negative for dizziness.       Objective:   Vitals:   11/23/19 0913  BP: 118/74  Pulse: 90  Temp: 98.2 F (36.8 C)  SpO2: 98%   BP Readings from Last 3 Encounters:  11/23/19 118/74  08/03/19 118/86  05/24/19 128/86   Wt Readings from Last 3 Encounters:  11/23/19 171 lb (77.6 kg)  05/24/19 171 lb (77.6 kg)  05/08/19 168 lb 3.2 oz (76.3 kg)   Body mass index is 31.28 kg/m.   Physical Exam    GENERAL APPEARANCE: Appears stated age, well appearing, NAD EYES: conjunctiva clear, no icterus HEENT: Left ear canal with a couple of areas of traumatic erythema-blood under the skin but no tears  in the skin or active bleeding.  Left tympanic membrane normal.,  Right ear canal normal.  Right tympanic membrane without diffuse erythema, effusion with loss of structures, lower portion of TM with mild bulging erythema oropharynx with no erythema, no thyromegaly, trachea midline, no cervical or supraclavicular lymphadenopathy LUNGS: Clear to auscultation without wheeze or crackles, unlabored breathing, good air entry bilaterally CARDIOVASCULAR: Normal S1,S2 without murmurs,  no edema SKIN: Warm, dry      Assessment & Plan:    See Problem List for Assessment and Plan of chronic medical problems.    This visit occurred during the SARS-CoV-2 public health emergency.  Safety protocols were in place, including screening questions prior to the visit, additional usage of staff PPE, and extensive cleaning of exam room while observing appropriate contact time as indicated for disinfecting solutions.

## 2019-11-23 ENCOUNTER — Ambulatory Visit (INDEPENDENT_AMBULATORY_CARE_PROVIDER_SITE_OTHER): Payer: No Typology Code available for payment source | Admitting: Internal Medicine

## 2019-11-23 ENCOUNTER — Other Ambulatory Visit: Payer: Self-pay

## 2019-11-23 ENCOUNTER — Encounter: Payer: Self-pay | Admitting: Internal Medicine

## 2019-11-23 DIAGNOSIS — J019 Acute sinusitis, unspecified: Secondary | ICD-10-CM | POA: Insufficient documentation

## 2019-11-23 DIAGNOSIS — H65191 Other acute nonsuppurative otitis media, right ear: Secondary | ICD-10-CM

## 2019-11-23 DIAGNOSIS — J0110 Acute frontal sinusitis, unspecified: Secondary | ICD-10-CM | POA: Diagnosis not present

## 2019-11-23 DIAGNOSIS — H9190 Unspecified hearing loss, unspecified ear: Secondary | ICD-10-CM | POA: Insufficient documentation

## 2019-11-23 DIAGNOSIS — H9191 Unspecified hearing loss, right ear: Secondary | ICD-10-CM | POA: Diagnosis not present

## 2019-11-23 DIAGNOSIS — H6690 Otitis media, unspecified, unspecified ear: Secondary | ICD-10-CM | POA: Insufficient documentation

## 2019-11-23 MED ORDER — LEVOCETIRIZINE DIHYDROCHLORIDE 5 MG PO TABS
ORAL_TABLET | ORAL | 1 refills | Status: DC
Start: 2019-11-23 — End: 2019-12-05

## 2019-11-23 MED ORDER — AZITHROMYCIN 250 MG PO TABS: ORAL_TABLET | ORAL | 0 refills | Status: DC

## 2019-11-23 NOTE — Patient Instructions (Signed)
You have a mild sinus infection and right ear infection.   Take the zpak as directed.  Use the flonase daily and take your allergy medication daily.   Let me know when you want to see ENT.     Please call if there is no improvement in your symptoms.

## 2019-11-23 NOTE — Assessment & Plan Note (Signed)
Acute Right tympanic membrane with some bulging erythema and evidence of effusion-possible early otitis media in the setting of sinus infection Start Z-Pak Use Flonase daily and continue Xyzal Follow-up if no improvement

## 2019-11-23 NOTE — Assessment & Plan Note (Signed)
Acute Symptoms consistent with sinus infection Call if no improvement ms consistent with possible sinus infection Will start Z-Pak Continue Flonase daily, Xyzal daily

## 2019-11-23 NOTE — Assessment & Plan Note (Signed)
Chronic Right ear only She has had a hearing loss for a while, but has not had this evaluated Discussed ENT referral and she would like to do that at some point, but not now-she will let me know when she wants the referral

## 2019-12-05 ENCOUNTER — Telehealth: Payer: Self-pay | Admitting: Allergy & Immunology

## 2019-12-05 DIAGNOSIS — J3089 Other allergic rhinitis: Secondary | ICD-10-CM

## 2019-12-05 MED ORDER — LEVOCETIRIZINE DIHYDROCHLORIDE 5 MG PO TABS: ORAL_TABLET | ORAL | 2 refills | Status: DC

## 2019-12-05 MED ORDER — FLUTICASONE PROPIONATE 50 MCG/ACT NA SUSP: 2.0000 | Freq: Every day | NASAL | 2 refills | Status: AC | PRN

## 2019-12-05 NOTE — Telephone Encounter (Signed)
Patient called and needs to have flonase and xyzal called into [facility] on [address]. [phone]

## 2019-12-05 NOTE — Telephone Encounter (Signed)
Refills sent. Patient informed of this as well as need for follow up in October.

## 2020-02-01 ENCOUNTER — Other Ambulatory Visit: Payer: Self-pay | Admitting: Podiatry

## 2020-02-01 ENCOUNTER — Other Ambulatory Visit: Payer: Self-pay

## 2020-02-01 ENCOUNTER — Ambulatory Visit (INDEPENDENT_AMBULATORY_CARE_PROVIDER_SITE_OTHER): Payer: 59

## 2020-02-01 ENCOUNTER — Encounter: Payer: Self-pay | Admitting: Podiatry

## 2020-02-01 ENCOUNTER — Ambulatory Visit: Payer: No Typology Code available for payment source | Admitting: Podiatry

## 2020-02-01 DIAGNOSIS — M205X1 Other deformities of toe(s) (acquired), right foot: Secondary | ICD-10-CM | POA: Diagnosis not present

## 2020-02-01 DIAGNOSIS — M205X2 Other deformities of toe(s) (acquired), left foot: Secondary | ICD-10-CM

## 2020-02-01 DIAGNOSIS — M5417 Radiculopathy, lumbosacral region: Secondary | ICD-10-CM

## 2020-02-01 DIAGNOSIS — G629 Polyneuropathy, unspecified: Secondary | ICD-10-CM

## 2020-02-01 NOTE — Progress Notes (Signed)
  Subjective:  Patient ID: Kathleen Ortiz, female    DOB: 1970/04/10,  MRN: [id number]  Chief Complaint  Patient presents with  . Foot Pain    pt states she has numbness and pain  that moves up feet to leg.     49 y.o. female presents with the above complaint. History confirmed with patient.  She describes painful shooting and numbness that comes up to the knee from the ankle, this happens over the medial, dorsal, and lateral ankle intermittently.  Is been happening for 3 years and has been worsening over the last years.  Hurts more at night.  She has tried pads in shoes and over-the-counter medications.  She had foot deformities as a child and was in corrective braces.  She has a history of low back disease with sciatica and impingement of L4 and L5, she saw a spine surgeon years ago and they recommended surgery but she like to hold off as long as possible.  Objective:  Physical Exam: warm, good capillary refill, no trophic changes or ulcerative lesions, normal DP and PT pulses, she is a positive Tinel sign in the tarsal tunnel bilaterally, pain with percussion to palpation of the "peroneal nerve as well as the saphenous and intermediate dorsal cutaneous nerves.  Radiographs: X-ray of both feet: First ray elevatus bilaterally with a dorsal spur on the right first metatarsal head Assessment:   1. Neuropathy   2. Lumbosacral radiculopathy      Plan:  Patient was evaluated and treated and all questions answered.  I discussed with her that majority of her symptoms are likely related to her spine disease.  I encouraged her to return to her spine surgeon for further treatment options and goals.  There is not clear evidence of isolated nerve entrapment or impingement within the tarsal tunnel or other nerves and her pain is diffuse and spread across multiple dermatomes.  We considered EMG and NCV to evaluate this, however I do not believe it will provide [place] with any new information.  Return  if symptoms worsen or fail to improve.

## 2020-03-07 NOTE — Progress Notes (Signed)
Subjective:    Patient ID: Kathleen Ortiz, female    DOB: 1970-08-06, 49 y.o.   MRN: [id number]  HPI The patient is here for an acute visit.   She states chronic lower back pain that radiates to both hips and down her left leg.  She also has numbness/tingling in her left leg more than the right leg.  She feels like this started in her feet moved its way out.  She is taking Tylenol which is not helping.  She has taken Aleve and ibuprofen, which helps slightly.  Laying down seems to be when her pain is the worst.  Constantly moving around helps.  She has been experiencing chronic lower back pain for a while.  She did go to [place].  She was diagnosed with chronic bilateral lumbago with bilateral sciatica, thoracolumbar scoliosis.  It was recommended that she continue over-the-counter medications, exercise program.  She then had an MRI and she states she was told there is no pinched nerve.  She continues to have pain.      Medications and allergies reviewed with patient and updated if appropriate.  Patient Active Problem List   Diagnosis Date Noted  . Otitis media 11/23/2019  . Acute sinus infection 11/23/2019  . Hearing loss 11/23/2019  . Baker's cyst of knee, left 05/08/2019  . Left knee pain 05/08/2019  . Infected cyst of skin 11/30/2018  . Neuralgia of flank, left 11/30/2018  . Perimenopausal symptoms 10/26/2018  . Obesity 10/26/2018  . Mild persistent asthma 08/02/2018  . Fatty liver 12/11/2017  . Allergic conjunctivitis 07/26/2017  . Gastroesophageal reflux disease 08/03/2016  . At risk of diabetes mellitus 08/03/2016  . Recurrent urticaria 05/11/2016  . Food allergy 05/11/2016  . Rash and nonspecific skin eruption 03/17/2016  . Right shoulder pain 08/02/2015  . Perennial and seasonal allergic rhinitis 03/20/2015  . Vitamin D deficiency 03/20/2015  . Eczema 03/20/2015    Current Outpatient Medications on File Prior to Visit    Medication Sig Dispense Refill  . albuterol (PROAIR HFA) 108 (90 Base) MCG/ACT inhaler Inhale 2 puffs into the lungs every 4 (four) hours as needed for wheezing or shortness of breath. 18 g 0  . fluticasone (FLONASE) 50 MCG/ACT nasal spray Place 2 sprays into both nostrils daily as needed for allergies or rhinitis. 16 g 2  . levocetirizine (XYZAL) 5 MG tablet TAKE 1 TABLET BY MOUTH EVERY DAY IN THE EVENING 30 tablet 2  . omeprazole (PRILOSEC) 40 MG capsule Take 40 mg by mouth daily.    . valACYclovir (VALTREX) 500 MG tablet Take by mouth.    . escitalopram (LEXAPRO) 20 MG tablet Take 20 mg by mouth daily. (Patient not taking: Reported on 03/08/2020)    . gabapentin (NEURONTIN) 100 MG capsule Take 1 capsule (100 mg total) by mouth at bedtime. (Patient not taking: Reported on 03/08/2020) 30 capsule 0  . montelukast (SINGULAIR) 10 MG tablet TAKE 1 TABLET BY MOUTH AT  BEDTIME (Patient not taking: Reported on 03/08/2020) 30 tablet 0  . sertraline (ZOLOFT) 50 MG tablet sertraline 50 mg tablet  TAKE 1 (ONE) TABLET DAILY (Patient not taking: Reported on 03/08/2020)     No current facility-administered medications on file prior to visit.    Past Medical History:  Diagnosis Date  . Chronic bronchitis (HCC)   . Eczema   . GERD (gastroesophageal reflux disease)   . Mild persistent asthma 08/02/2018  . Urticaria     Past  Surgical History:  Procedure Laterality Date  . TYMPANOSTOMY TUBE PLACEMENT      Social History   Socioeconomic History  . Marital status: Single    Spouse name: Not on file  . Number of children: Not on file  . Years of education: Not on file  . Highest education level: Not on file  Occupational History  . Not on file  Tobacco Use  . Smoking status: Former Smoker    Packs/day: 0.10    Types: Cigarettes  . Smokeless tobacco: Never Used  Vaping Use  . Vaping Use: Never used  Substance and Sexual Activity  . Alcohol use: Yes  . Drug use: No  . Sexual activity: Not on  file  Other Topics Concern  . Not on file  Social History Narrative   No regular exercise      Social Determinants of Health   Financial Resource Strain:   . Difficulty of Paying Living Expenses: Not on file  Food Insecurity:   . Worried About Programme researcher, broadcasting/film/video in the Last Year: Not on file  . Ran Out of Food in the Last Year: Not on file  Transportation Needs:   . Lack of Transportation (Medical): Not on file  . Lack of Transportation (Non-Medical): Not on file  Physical Activity:   . Days of Exercise per Week: Not on file  . Minutes of Exercise per Session: Not on file  Stress:   . Feeling of Stress : Not on file  Social Connections:   . Frequency of Communication with Friends and Family: Not on file  . Frequency of Social Gatherings with Friends and Family: Not on file  . Attends Religious Services: Not on file  . Active Member of Clubs or Organizations: Not on file  . Attends Banker Meetings: Not on file  . Marital Status: Not on file    Family History  Problem Relation Age of Onset  . Arthritis Mother   . Cancer Mother        Lung & colon  . Hypertension Mother   . Diabetes Maternal Aunt   . Arthritis Maternal Grandmother   . Hypertension Maternal Grandmother   . Heart disease Maternal Grandmother   . Diabetes Maternal Grandmother   . Cancer Paternal Grandfather     Review of Systems  Constitutional: Negative for fever.  Gastrointestinal:       No fecal incontinence  Genitourinary:       No urinary incontinence  Musculoskeletal: Positive for back pain (with pain to b/l hips and left leg).  Neurological: Positive for numbness (b/l legs). Negative for weakness.       Objective:   Vitals:   03/08/20 1040  BP: 128/88  Pulse: 95  Temp: 98.3 F (36.8 C)   BP Readings from Last 3 Encounters:  03/08/20 128/88  11/23/19 118/74  08/03/19 118/86   Wt Readings from Last 3 Encounters:  03/08/20 166 lb 9.6 oz (75.6 kg)  11/23/19 171 lb  (77.6 kg)  05/24/19 171 lb (77.6 kg)   Body mass index is 30.47 kg/m.   Physical Exam Constitutional:      General: She is not in acute distress.    Appearance: Normal appearance. She is not ill-appearing.  HENT:     Head: Normocephalic and atraumatic.  Musculoskeletal:        General: Tenderness (Lateral SI joints and lower lumbar spine) present.     Right lower leg: No edema.  Left lower leg: No edema.  Skin:    General: Skin is warm and dry.  Neurological:     Mental Status: She is alert.     Sensory: No sensory deficit.     Motor: No weakness.     Coordination: Coordination normal.     Gait: Gait normal.     Comments: Pain improved with straight leg raise bilaterally            Assessment & Plan:    See Problem List for Assessment and Plan of chronic medical problems.    This visit occurred during the SARS-CoV-2 public health emergency.  Safety protocols were in place, including screening questions prior to the visit, additional usage of staff PPE, and extensive cleaning of exam room while observing appropriate contact time as indicated for disinfecting solutions.

## 2020-03-08 ENCOUNTER — Encounter: Payer: Self-pay | Admitting: Internal Medicine

## 2020-03-08 ENCOUNTER — Other Ambulatory Visit: Payer: Self-pay

## 2020-03-08 ENCOUNTER — Ambulatory Visit (INDEPENDENT_AMBULATORY_CARE_PROVIDER_SITE_OTHER): Payer: 59 | Admitting: Internal Medicine

## 2020-03-08 VITALS — BP 128/88 | HR 95 | Temp 98.3°F | Ht 62.0 in | Wt 166.6 lb

## 2020-03-08 DIAGNOSIS — M5440 Lumbago with sciatica, unspecified side: Secondary | ICD-10-CM | POA: Diagnosis not present

## 2020-03-08 DIAGNOSIS — G8929 Other chronic pain: Secondary | ICD-10-CM

## 2020-03-08 MED ORDER — GABAPENTIN 100 MG PO CAPS
100.0000 mg | ORAL_CAPSULE | Freq: Every day | ORAL | 3 refills | Status: DC
Start: 2020-03-08 — End: 2021-05-05

## 2020-03-08 MED ORDER — MELOXICAM 15 MG PO TABS: 15.0000 mg | ORAL_TABLET | Freq: Every day | ORAL | 0 refills | Status: DC

## 2020-03-08 NOTE — Assessment & Plan Note (Signed)
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  Okay to take Tylenol Referred for physical therapy Will refer to Dr. Katrinka Blazing for further evaluation-possible adjustment which she may benefit from

## 2020-03-08 NOTE — Patient Instructions (Addendum)
   Medications changes include :   Gabapentin 100-300 mg at bedtime.  Start meloxicam 15 mg daily with food ( do not take any advil or aleve while taking this.   Your prescription(s) have been submitted to your pharmacy. Please take as directed and contact our office if you believe you are having problem(s) with the medication(s).   A referral was ordered for sports medicine - Dr Katrinka Blazing and Physical therapy.       Someone from their office will call you to schedule an appointment.

## 2020-03-18 NOTE — Progress Notes (Signed)
[facility] [address] [place] [address] Phone: [phone] Subjective:   I Ronelle Nigh am serving as a Neurosurgeon for Dr. Antoine Primas.  This visit occurred during the SARS-CoV-2 public health emergency.  Safety protocols were in place, including screening questions prior to the visit, additional usage of staff PPE, and extensive cleaning of exam room while observing appropriate contact time as indicated for disinfecting solutions.   I'm seeing this patient by the request  of:  Pincus Sanes, MD  CC: Low back pain with radiation  HQP:[id number]  TERRYE DOMBROSKY is a 49 y.o. female coming in with complaint of bilateral hip and back pain. Numbness and tingling mostly on the left side. Back stiffness and some weakness in her legs.  States that she can have significant amount of cramping at night.  Only the large toe on the left leg is always known.  Onset- Chronic Location - Left side is worse  Duration- all day and worse at night laying down  Character- sharp, dull, achy, sore, throbbing  Aggravating factors- laying down, standing for long periods of time  Reliving factors-  Therapies tried- ice, heat, topical, oral  Severity- 10/10 at its worse at night day time it depends    Patient has been seen by neurosurgery.  Patient brings in an MRI.  MRI was dated November 2 of this year.  MRI showed no significant nerve impingement with very mild degenerative disc disease.   Past Medical History:  Diagnosis Date   Chronic bronchitis (HCC)    Eczema    GERD (gastroesophageal reflux disease)    Mild persistent asthma 08/02/2018   Urticaria    Past Surgical History:  Procedure Laterality Date   TYMPANOSTOMY TUBE PLACEMENT     Social History   Socioeconomic History   Marital status: Single    Spouse name: Not on file   Number of children: Not on file   Years of education: Not on file   Highest education level: Not on file   Occupational History   Not on file  Tobacco Use   Smoking status: Former Smoker    Packs/day: 0.10    Types: Cigarettes   Smokeless tobacco: Never Used  Vaping Use   Vaping Use: Never used  Substance and Sexual Activity   Alcohol use: Yes   Drug use: No   Sexual activity: Not on file  Other Topics Concern   Not on file  Social History Narrative   No regular exercise      Social Determinants of Health   Financial Resource Strain: Not on file  Food Insecurity: Not on file  Transportation Needs: Not on file  Physical Activity: Not on file  Stress: Not on file  Social Connections: Not on file   Allergies  Allergen Reactions   Doxycycline Other (See Comments)    headache   Shellfish Allergy    Latex Rash   Family History  Problem Relation Age of Onset   Arthritis Mother    Cancer Mother        Lung & colon   Hypertension Mother    Diabetes Maternal Aunt    Arthritis Maternal Grandmother    Hypertension Maternal Grandmother    Heart disease Maternal Grandmother    Diabetes Maternal Grandmother    Cancer Paternal Grandfather       Current Outpatient Medications (Respiratory):    albuterol (PROAIR HFA) 108 (90 Base) MCG/ACT inhaler, Inhale 2 puffs into the  lungs every 4 (four) hours as needed for wheezing or shortness of breath.   fluticasone (FLONASE) 50 MCG/ACT nasal spray, Place 2 sprays into both nostrils daily as needed for allergies or rhinitis.   levocetirizine (XYZAL) 5 MG tablet, TAKE 1 TABLET BY MOUTH EVERY DAY IN THE EVENING  Current Outpatient Medications (Analgesics):    meloxicam (MOBIC) 15 MG tablet, Take 1 tablet (15 mg total) by mouth daily.   Current Outpatient Medications (Other):    gabapentin (NEURONTIN) 100 MG capsule, Take 1-3 capsules (100-300 mg total) by mouth at bedtime.   omeprazole (PRILOSEC) 40 MG capsule, Take 40 mg by mouth daily.   valACYclovir (VALTREX) 500 MG tablet, Take by mouth.   tiZANidine  (ZANAFLEX) 2 MG tablet, Take 1 tablet (2 mg total) by mouth at bedtime.   Reviewed prior external information including notes and imaging from  primary care provider As well as notes that were available from care everywhere and other healthcare systems.  Past medical history, social, surgical and family history all reviewed in electronic medical record.  No pertanent information unless stated regarding to the chief complaint.   Review of Systems:  No headache, visual changes, nausea, vomiting, diarrhea, constipation, dizziness, abdominal pain, skin rash, fevers, chills, night sweats, weight loss, swollen lymph nodes, joint swelling, chest pain, shortness of breath, mood changes. POSITIVE muscle aches, body aches  Objective  Blood pressure 130/90, pulse 89, height 5\' 2"  (1.575 m), weight 170 lb (77.1 kg), last menstrual period 03/16/2020, SpO2 98 %.   General: No apparent distress alert and oriented x3 mood and affect normal, dressed appropriately.  HEENT: Pupils equal, extraocular movements intact slight pallor of the conjunctiva Respiratory: Patient's speak in full sentences and does not appear short of breath  Cardiovascular: No lower extremity edema, non tender, no erythema  Neuro: Cranial nerves II through XII are intact, neurovascularly intact in all extremities with 2+ DTRs and 2+ pulses.  Gait normal with good balance and coordination.  MSK: Left hip exam does show the patient does have some mild decrease in FABER test.  Some mild pain with internal rotation in the groin area.  Does have full range of motion though.  5-5 strength of the lower extremities.  Deep tendon reflexes are intact.  Low back exam minorly tender to palpation diffusely in the paraspinal musculature of the lumbar spine.  Mild tightness with straight leg test no radicular symptoms though.  Osteopathic findings  T11 extended rotated and side bent right L5 flexed rotated and side bent right Sacrum left on  left   [id number]; 15 additional minutes spent for Therapeutic exercises as stated in above notes.  This included exercises focusing on stretching, strengthening, with significant focus on eccentric aspects.   Long term goals include an improvement in range of motion, strength, endurance as well as avoiding reinjury. Patient's frequency would include in 1-2 times a day, 3-5 times a week for a duration of 6-12 weeks. Low back exercises that included:  Pelvic tilt/bracing instruction to focus on control of the pelvic girdle and lower abdominal muscles  Glute strengthening exercises, focusing on proper firing of the glutes without engaging the low back muscles Proper stretching techniques for maximum relief for the hamstrings, hip flexors, low back and some rotation where tolerated   Proper technique shown and discussed handout in great detail with ATC.  All questions were discussed and answered.      Impression and Recommendations:     The above documentation has been reviewed  and is accurate and complete Judi Saa, DO

## 2020-03-19 ENCOUNTER — Encounter: Payer: Self-pay | Admitting: Family Medicine

## 2020-03-19 ENCOUNTER — Ambulatory Visit (INDEPENDENT_AMBULATORY_CARE_PROVIDER_SITE_OTHER): Payer: 59

## 2020-03-19 ENCOUNTER — Other Ambulatory Visit: Payer: Self-pay

## 2020-03-19 ENCOUNTER — Ambulatory Visit (INDEPENDENT_AMBULATORY_CARE_PROVIDER_SITE_OTHER): Payer: 59 | Admitting: Family Medicine

## 2020-03-19 VITALS — BP 130/90 | HR 89 | Ht 62.0 in | Wt 170.0 lb

## 2020-03-19 DIAGNOSIS — M999 Biomechanical lesion, unspecified: Secondary | ICD-10-CM | POA: Diagnosis not present

## 2020-03-19 DIAGNOSIS — M25552 Pain in left hip: Secondary | ICD-10-CM | POA: Diagnosis not present

## 2020-03-19 DIAGNOSIS — M5440 Lumbago with sciatica, unspecified side: Secondary | ICD-10-CM

## 2020-03-19 DIAGNOSIS — G8929 Other chronic pain: Secondary | ICD-10-CM

## 2020-03-19 DIAGNOSIS — M2550 Pain in unspecified joint: Secondary | ICD-10-CM

## 2020-03-19 LAB — IBC PANEL
Iron: 57 ug/dL (ref 42–145)
Saturation Ratios: 14.3 % — ABNORMAL LOW (ref 20.0–50.0)
Transferrin: 284 mg/dL (ref 212.0–360.0)

## 2020-03-19 LAB — CBC WITH DIFFERENTIAL/PLATELET
Basophils Absolute: 0 10*3/uL (ref 0.0–0.1)
Basophils Relative: 0.7 % (ref 0.0–3.0)
Eosinophils Absolute: 0.2 10*3/uL (ref 0.0–0.7)
Eosinophils Relative: 3.6 % (ref 0.0–5.0)
HCT: 35.5 % — ABNORMAL LOW (ref 36.0–46.0)
Hemoglobin: 11.9 g/dL — ABNORMAL LOW (ref 12.0–15.0)
Lymphocytes Relative: 29.4 % (ref 12.0–46.0)
Lymphs Abs: 1.5 10*3/uL (ref 0.7–4.0)
MCHC: 33.4 g/dL (ref 30.0–36.0)
MCV: 91.5 fl (ref 78.0–100.0)
Monocytes Absolute: 0.4 10*3/uL (ref 0.1–1.0)
Monocytes Relative: 8.7 % (ref 3.0–12.0)
Neutro Abs: 2.9 10*3/uL (ref 1.4–7.7)
Neutrophils Relative %: 57.6 % (ref 43.0–77.0)
Platelets: 206 10*3/uL (ref 150.0–400.0)
RBC: 3.88 Mil/uL (ref 3.87–5.11)
RDW: 13 % (ref 11.5–15.5)
WBC: 5 10*3/uL (ref 4.0–10.5)

## 2020-03-19 LAB — VITAMIN D 25 HYDROXY (VIT D DEFICIENCY, FRACTURES): VITD: 21.96 ng/mL — ABNORMAL LOW (ref 30.00–100.00)

## 2020-03-19 LAB — C-REACTIVE PROTEIN: CRP: <1 mg/dL (ref 0.5–20.0)

## 2020-03-19 LAB — SEDIMENTATION RATE: Sed Rate: 26 mm/hr — ABNORMAL HIGH (ref 0–20)

## 2020-03-19 MED ORDER — TIZANIDINE HCL 2 MG PO TABS: 2.0000 mg | ORAL_TABLET | Freq: Every day | ORAL | 0 refills | Status: DC

## 2020-03-19 NOTE — Assessment & Plan Note (Signed)
Patient is having leg pain and does have some cramping.  Likely could be secondary to iron deficiency based on patient's white pallor of her eyes and fingernails.  Patient does have meloxicam.  Given short course of Zanaflex to see if this would be beneficial.  Attempted osteopathic manipulation today.  Patient did respond fairly well.  X-rays of the pelvic area ordered today.  Follow-up with me again in 4 to 6 weeks

## 2020-03-19 NOTE — Patient Instructions (Addendum)
Left hip xray Labs today Hip flexor stretches 3x a week Iron 65mg  with 500 mg of Vit C Zanaflex 2mg  at night See me again in 6 weeks

## 2020-03-19 NOTE — Assessment & Plan Note (Signed)

## 2020-03-21 LAB — ANTI-NUCLEAR AB-TITER (ANA TITER): ANA Titer 1: 1:40 {titer} — ABNORMAL HIGH

## 2020-03-21 LAB — PTH, INTACT AND CALCIUM
Calcium: 9.1 mg/dL (ref 8.6–10.2)
PTH: 31 pg/mL (ref 14–64)

## 2020-03-21 LAB — ANA: Anti Nuclear Antibody (ANA): POSITIVE — AB

## 2020-03-28 ENCOUNTER — Ambulatory Visit: Payer: 59 | Attending: Internal Medicine

## 2020-03-28 ENCOUNTER — Other Ambulatory Visit: Payer: Self-pay

## 2020-03-28 DIAGNOSIS — G8929 Other chronic pain: Secondary | ICD-10-CM | POA: Insufficient documentation

## 2020-03-28 DIAGNOSIS — M25562 Pain in left knee: Secondary | ICD-10-CM | POA: Diagnosis present

## 2020-03-28 DIAGNOSIS — M62830 Muscle spasm of back: Secondary | ICD-10-CM | POA: Diagnosis present

## 2020-03-28 DIAGNOSIS — M6281 Muscle weakness (generalized): Secondary | ICD-10-CM | POA: Diagnosis present

## 2020-03-28 DIAGNOSIS — M545 Low back pain, unspecified: Secondary | ICD-10-CM | POA: Diagnosis not present

## 2020-03-28 DIAGNOSIS — R293 Abnormal posture: Secondary | ICD-10-CM | POA: Diagnosis present

## 2020-03-28 NOTE — Therapy (Signed)
[place], [place], [address] Phone: [phone]   Fax:  [phone]  Physical Therapy Evaluation  Patient Details  Name: Kathleen Ortiz MRN: [id number] Date of Birth: [birth year] Referring Provider (PT): Hulan Saas, DO   Encounter Date: 03/28/2020   PT End of Session - 03/28/20 1158    Visit Number 1    Number of Visits 12    Date for PT Re-Evaluation 05/10/20    Authorization Type [employer]    PT Start Time 1200    PT Stop Time 1245    PT Time Calculation (min) 45 min    Activity Tolerance Patient limited by pain    Behavior During Therapy [facility] for tasks assessed/performed           Past Medical History:  Diagnosis Date   Chronic bronchitis ([place])    Eczema    GERD (gastroesophageal reflux disease)    Mild persistent asthma 08/02/2018   Urticaria     Past Surgical History:  Procedure Laterality Date   TYMPANOSTOMY TUBE PLACEMENT      There were no vitals filed for this visit.    Subjective Assessment - 03/28/20 1203    Subjective She reports she saw Dr Tamala Julian to [facility] Lt hip.   She reports she her hip feels food and knee feels weak. She also has LBP.Marland Kitchen  LBP since 2012 with herniated disc.    LT leg is shorter than RT.  Pain with  lying down  flat.  changes position  to sides also.    Limitations House hold activities   sleep, bend   Diagnostic tests Xray,  MRI:    Currently in Pain? Yes    Pain Score 5     Pain Location Back    Pain Orientation Left;Posterior;Lower    Pain Descriptors / Indicators Aching    Pain Type Chronic pain    Pain Onset More than a month ago    Pain Frequency Constant    Aggravating Factors  bending , home tasks, lying    Pain Relieving Factors Exercise  , meds,  lye              [facility] PT Assessment - 03/28/20 0001      Assessment   Medical Diagnosis poly arthalgia    Referring Provider (PT) Hulan Saas, DO    Onset Date/Surgical  Date --   2012 back pain   Next MD Visit 2 weeks    Prior Therapy No      Precautions   Precautions None      Restrictions   Weight Bearing Restrictions No      Balance Screen   Has the patient fallen in the past 6 months No      Prior Function   Level of Independence Independent    Vocation Retired      Associate Professor   Overall Cognitive Status Within Functional Limits for tasks assessed      Observation/Other Assessments   Focus on Therapeutic Outcomes (FOTO)  46%  with possible improvement to 60%      Posture/Postural Control   Posture Comments Stand RT pelvis higher and RT shoulder slightly higher      ROM / Strength   AROM / PROM / Strength AROM;PROM;Strength      AROM   AROM Assessment Site Lumbar    Lumbar Flexion 100    Lumbar Extension 10    Lumbar - Right Side Bend 10  Lumbar - Left Side Bend 10      PROM   Overall PROM Comments decr LT hip extension, Decr Lt quads in prone      Strength   Overall Strength Comments 4/5 Lt hip flexion with pain  others WNL.      Flexibility   Soft Tissue Assessment /Muscle Length yes    Hamstrings Lt 70 RT 75    ITB + LT      Palpation   SI assessment  LT ASIS distal LT leg shorter .  ? LT side drops in prone press up    Palpation comment Tender LT SI and lower lumbar spine.      Ambulation/Gait   Gait Comments Normal                      Objective measurements completed on examination: See above findings.               PT Education - 03/28/20 1213    Education Details POC   , HEP.  FOTO score and probable  progress   Heel lift management in 2-4 hours out 2-4 hours 2-3 days and remove layer if think the height is too much    Person(s) Educated Patient    Methods Explanation;Tactile cues;Verbal cues;Handout    Comprehension Verbalized understanding;Returned demonstration            PT Short Term Goals - 03/28/20 1159      PT SHORT TERM GOAL #1   Title She will be indpendnet with  initial HEP    Time 3    Period Weeks    Status New      PT SHORT TERM GOAL #2   Title Pain decreased 20% or more    Time 3    Period Weeks    Status New      PT SHORT TERM GOAL #3   Title FOTO score improved to 52%    Time 3    Period Weeks    Status New      PT SHORT TERM GOAL #4   Title RT patella pain decr 25% or more    Time 3    Period Weeks    Status New             PT Long Term Goals - 03/28/20 1200      PT LONG TERM GOAL #1   Title She will be indpendnet with all HEP issued    Time 6    Period Weeks    Status New      PT LONG TERM GOAL #2   Title Pain decreased 50% or more  in LT lower back    Time 6    Period Weeks    Status New      PT LONG TERM GOAL #3   Title FOTO score improved to  60%    Time 6    Period Weeks    Status New      PT LONG TERM GOAL #4   Title She will report able to get comfortable to sleep and decr postion changes to 2-3per night    Time 6    Period Weeks    Status New      PT LONG TERM GOAL #5   Title She will report no knee pain with home activity    Time 6    Period Weeks    Status New  Plan - 03/28/20 1158    Clinical Impression Statement Ms Ates presents with chronic lower back pain LT and a more recent onset of medial patella pain.  Her back pain has onset years ago.   She has some asymetries of pelvis and trunk,  stiffness of spine and tenderness LT lower lumbar and SI area.  She has mildly + Obers on LT. LT patella appears to be tracking issue medially. She should improve with skilled PT and consistent HEP form patient    Personal Factors and Comorbidities Time since onset of injury/illness/exacerbation;Past/Current Experience    Examination-Activity Limitations Squat;Locomotion Level;Bend;Sleep;Lift    Examination-Participation Restrictions Community Activity;Shop;Cleaning    Stability/Clinical Decision Making Stable/Uncomplicated    Clinical Decision Making Low    Rehab Potential  Good    PT Frequency 2x / week    PT Duration 6 weeks    PT Treatment/Interventions Electrical Stimulation;Moist Heat;Therapeutic activities;Therapeutic exercise;Manual techniques;Patient/family education;Passive range of motion;Dry needling;Taping    PT Next Visit Plan Reveiw stretching and add to HEP,  taping for patella Manual for pain and stiffness.   modalities  Assess heel lift benefits    PT Home Exercise Plan piriformis stretch ,, SKC isometrics for hip extension (MET)    Consulted and Agree with Plan of Care Patient           Patient will benefit from skilled therapeutic intervention in order to improve the following deficits and impairments:  Pain,Postural dysfunction,Increased muscle spasms,Decreased range of motion,Decreased activity tolerance,Decreased strength  Visit Diagnosis: Chronic bilateral low back pain, unspecified whether sciatica present  Acute pain of left knee  Abnormal posture  Muscle spasm of back  Muscle weakness (generalized)     Problem List Patient Active Problem List   Diagnosis Date Noted   Nonallopathic lesion of sacral region 03/19/2020   Chronic bilateral low back pain with sciatica 03/08/2020   Hearing loss 11/23/2019   Baker's cyst of knee, left 05/08/2019   Left knee pain 05/08/2019   Infected cyst of skin 11/30/2018   Neuralgia of flank, left 11/30/2018   Perimenopausal symptoms 10/26/2018   Obesity 10/26/2018   Mild persistent asthma 08/02/2018   Fatty liver 12/11/2017   Allergic conjunctivitis 07/26/2017   Gastroesophageal reflux disease 08/03/2016   At risk of diabetes mellitus 08/03/2016   Recurrent urticaria 05/11/2016   Food allergy 05/11/2016   Rash and nonspecific skin eruption 03/17/2016   Right shoulder pain 08/02/2015   Perennial and seasonal allergic rhinitis 03/20/2015   Vitamin D deficiency 03/20/2015   Eczema 03/20/2015    Darrel Hoover  PT 03/28/2020, 1:20 PM  [facility] [address] [place], [place], [address] Phone: [phone]   Fax:  [phone]  Name: Kathleen Ortiz MRN: [id number] Date of Birth: 12/20/1970

## 2020-03-28 NOTE — Patient Instructions (Signed)
Piriformis stretch 30 sec x 3 2x/day LT SKC  With right leg flat  With isometric LT hip ext x 10 sec 3-5 reps 2x/day

## 2020-04-11 ENCOUNTER — Other Ambulatory Visit: Payer: Self-pay

## 2020-04-11 ENCOUNTER — Encounter: Payer: Self-pay | Admitting: Internal Medicine

## 2020-04-11 ENCOUNTER — Telehealth (INDEPENDENT_AMBULATORY_CARE_PROVIDER_SITE_OTHER): Payer: 59 | Admitting: Internal Medicine

## 2020-04-11 DIAGNOSIS — U071 COVID-19: Secondary | ICD-10-CM | POA: Insufficient documentation

## 2020-04-11 MED ORDER — PREDNISONE 20 MG PO TABS: 40.0000 mg | ORAL_TABLET | Freq: Every day | ORAL | 0 refills | Status: DC

## 2020-04-11 MED ORDER — PROMETHAZINE-DM 6.25-15 MG/5ML PO SYRP: 5.0000 mL | ORAL_SOLUTION | Freq: Three times a day (TID) | ORAL | 0 refills | Status: DC | PRN

## 2020-04-11 NOTE — Progress Notes (Signed)
Virtual Visit via Audio Note  I connected with Kathleen Ortiz on 04/11/20 at 11:00 AM EST by an audio-only enabled telemedicine application and verified that I am speaking with the correct person using two identifiers.  The patient and the provider were at separate locations throughout the entire encounter. Patient location: home, Provider location: work   I discussed the limitations of evaluation and management by telemedicine and the availability of in person appointments. The patient expressed understanding and agreed to proceed. The patient and the provider were the only parties present for the visit unless noted in HPI below.  History of Present Illness: The patient is a 50 y.o. female with visit for positive home covid-19 test yesterday. Started Sunday night with sinus pressure and drainage. Has sore throat also and headache. Denies SOB but is coughing slightly. Overall it is not improving. Has tried mucinex and sinus pill also which did not help. Also tried dayquil and nyquil. Has had 2 shots and booster scheduled for this week which she had to push back. Does have concurrent asthma but does not seem to be flaring up with this. She does have albuterol inhaler on hand.  Observations/Objective: Voice strong, speaking in full sentences, minimal coughing during visit, A and O times 3  Assessment and Plan: See problem oriented charting  Follow Up Instructions: rx prednisone and promethazine/dm cough syrup  Visit time 11 minutes in non-face to face communication with patient and coordination of care  I discussed the assessment and treatment plan with the patient. The patient was provided an opportunity to ask questions and all were answered. The patient agreed with the plan and demonstrated an understanding of the instructions.   The patient was advised to call back or seek an in-person evaluation if the symptoms worsen or if the condition fails to improve as anticipated.  Myrlene Broker, MD

## 2020-04-11 NOTE — Assessment & Plan Note (Addendum)
Rx promethazine/dm and prednisone given concurrent asthma. Advised on quarantine timeline as well as when she can take the booster for covid-19.

## 2020-04-15 ENCOUNTER — Telehealth: Payer: Self-pay | Admitting: Internal Medicine

## 2020-04-15 NOTE — Telephone Encounter (Signed)
Yes - ok for resp clinic

## 2020-04-15 NOTE — Telephone Encounter (Signed)
Post COVID respiratory number given for patient to call and self schedule.

## 2020-04-15 NOTE — Telephone Encounter (Signed)
  Seeking advice  Patient calling seeking advice for weakness Patient states she feels fatigued.Recent positive COVID test Seeking advice

## 2020-04-17 ENCOUNTER — Ambulatory Visit: Payer: 59

## 2020-04-24 ENCOUNTER — Encounter: Payer: Self-pay | Admitting: Physical Therapy

## 2020-04-24 ENCOUNTER — Other Ambulatory Visit: Payer: Self-pay

## 2020-04-24 ENCOUNTER — Ambulatory Visit: Payer: 59 | Attending: Internal Medicine | Admitting: Physical Therapy

## 2020-04-24 DIAGNOSIS — M25562 Pain in left knee: Secondary | ICD-10-CM | POA: Diagnosis present

## 2020-04-24 DIAGNOSIS — R293 Abnormal posture: Secondary | ICD-10-CM | POA: Insufficient documentation

## 2020-04-24 DIAGNOSIS — M6281 Muscle weakness (generalized): Secondary | ICD-10-CM | POA: Insufficient documentation

## 2020-04-24 DIAGNOSIS — M545 Low back pain, unspecified: Secondary | ICD-10-CM | POA: Insufficient documentation

## 2020-04-24 DIAGNOSIS — G8929 Other chronic pain: Secondary | ICD-10-CM | POA: Diagnosis present

## 2020-04-24 DIAGNOSIS — M62830 Muscle spasm of back: Secondary | ICD-10-CM | POA: Insufficient documentation

## 2020-04-24 NOTE — Therapy (Signed)
[place], [place], [address] Phone: [phone]   Fax:  [phone]  Physical Therapy Treatment  Patient Details  Name: Kathleen Ortiz MRN: [id number] Date of Birth: Oct 16, 1970 Referring Provider (PT): Hulan Saas, DO   Encounter Date: 04/24/2020   PT End of Session - 04/24/20 1246    Visit Number 2    Number of Visits 12    Date for PT Re-Evaluation 05/10/20    Authorization Type [employer]    PT Start Time 1102    PT Stop Time 1145    PT Time Calculation (min) 43 min    Activity Tolerance Patient tolerated treatment well    Behavior During Therapy [facility] for tasks assessed/performed           Past Medical History:  Diagnosis Date  . Chronic bronchitis ([place])   . Eczema   . GERD (gastroesophageal reflux disease)   . Mild persistent asthma 08/02/2018  . Urticaria     Past Surgical History:  Procedure Laterality Date  . TYMPANOSTOMY TUBE PLACEMENT      There were no vitals filed for this visit.   Subjective Assessment - 04/24/20 1104    Subjective Pt. returns for first follow up visit since initial evaluation. She is recovering from Covid infection which had begun about 2 1/2 weeks ago and reports LBP exacerbated with sedentary status while ill and with recent winter weather. Minimal knee symptoms but attributes to lack of walking recently. Has not used heel lift much for same reasons.    Currently in Pain? Yes    Pain Score 5     Pain Location Back    Pain Orientation Left;Right    Pain Descriptors / Indicators Aching    Pain Type Chronic pain    Pain Onset More than a month ago    Pain Frequency Constant    Aggravating Factors  bending, IADLS    Pain Relieving Factors exercise, medication              [facility] PT Assessment - 04/24/20 0001      Observation/Other Assessments   Observations no LLD noted today                         [facility] Adult PT  Treatment/Exercise - 04/24/20 0001      Exercises   Exercises Lumbar      Lumbar Exercises: Seated   Other Seated Lumbar Exercises trunk flexion with left hip/leg in figure 4 position for posterior innominate rotation x 10 reps      Lumbar Exercises: Supine   Pelvic Tilt 15 reps    Clam 15 reps    Clam Limitations green band    Bridge with clamshell 15 reps   green band   Other Supine Lumbar Exercises hip adduction isometric with ball 5 sec x 15 reps      Manual Therapy   Manual Therapy Joint mobilization;Soft tissue mobilization    Joint Mobilization PA at left ASIS grade I-III-noted symptom relief, attempted right side but held due to increased LBP, LAD left hip grade I-III oscillations stopped after 4 min due to pt. noting increased back soreness    Soft tissue mobilization Right lower lumbar/upper gluteal region                  PT Education - 04/24/20 1245    Education Details exercises & HEP updates, lumbar/SI anatomy, pain education,  tennis ball release for paraspinal/gluteal trigger point release    Person(s) Educated Patient    Methods Explanation;Demonstration;Verbal cues;Tactile cues    Comprehension Returned demonstration;Verbalized understanding            PT Short Term Goals - 03/28/20 1159      PT SHORT TERM GOAL #1   Title She will be indpendnet with initial HEP    Time 3    Period Weeks    Status New      PT SHORT TERM GOAL #2   Title Pain decreased 20% or more    Time 3    Period Weeks    Status New      PT SHORT TERM GOAL #3   Title FOTO score improved to 52%    Time 3    Period Weeks    Status New      PT SHORT TERM GOAL #4   Title RT patella pain decr 25% or more    Time 3    Period Weeks    Status New             PT Long Term Goals - 03/28/20 1200      PT LONG TERM GOAL #1   Title She will be indpendnet with all HEP issued    Time 6    Period Weeks    Status New      PT LONG TERM GOAL #2   Title Pain decreased 50%  or more  in LT lower back    Time 6    Period Weeks    Status New      PT LONG TERM GOAL #3   Title FOTO score improved to  60%    Time 6    Period Weeks    Status New      PT LONG TERM GOAL #4   Title She will report able to get comfortable to sleep and decr postion changes to 2-3per night    Time 6    Period Weeks    Status New      PT LONG TERM GOAL #5   Title She will report no knee pain with home activity    Time 6    Period Weeks    Status New                 Plan - 04/24/20 1247    Clinical Impression Statement No overt LLD noted today. Pt. demonstrated directional preference for SI region for left posterior innominate rotation so work on this with manual therapy and exercises today. She also noted muscle spasm in right lower lumbar/gluteal region with relief noted following STM. Otherwise worked on lumbopelvic and hip stabilization with exercises as noted per flowsheet. Given symptom chronicity expect gradual progress with therapy with (therapy) goals ongoing.    Personal Factors and Comorbidities Time since onset of injury/illness/exacerbation;Past/Current Experience    Examination-Activity Limitations Squat;Locomotion Level;Bend;Sleep;Lift    Examination-Participation Restrictions Community Activity;Shop;Cleaning    Stability/Clinical Decision Making Stable/Uncomplicated    Clinical Decision Making Low    Rehab Potential Good    PT Frequency 2x / week    PT Duration 6 weeks    PT Treatment/Interventions Electrical Stimulation;Moist Heat;Therapeutic activities;Therapeutic exercise;Manual techniques;Patient/family education;Passive range of motion;Dry needling;Taping    PT Next Visit Plan check response HEP for updates including strengthening exercises and self left posterior innominate rotation in seated, manual prn for AP mobs left innominate and STM prn, progress lumbopelvic stabilization as tolerated,  stretch piriformis/hip rotators    PT Home Exercise Plan  piriformis stretch ,, SKC isometrics for hip extension (MET), 04/24/20 added posterior pelvic tilt, clamshell, hip bridge with clamshell, self left posterior innominate rotation with left leg in figure 4 position in sitting    Consulted and Agree with Plan of Care Patient           Patient will benefit from skilled therapeutic intervention in order to improve the following deficits and impairments:  Pain,Postural dysfunction,Increased muscle spasms,Decreased range of motion,Decreased activity tolerance,Decreased strength  Visit Diagnosis: Chronic bilateral low back pain, unspecified whether sciatica present  Acute pain of left knee  Abnormal posture  Muscle spasm of back  Muscle weakness (generalized)     Problem List Patient Active Problem List   Diagnosis Date Noted  . COVID-19 04/11/2020  . Nonallopathic lesion of sacral region 03/19/2020  . Chronic bilateral low back pain with sciatica 03/08/2020  . Hearing loss 11/23/2019  . Baker's cyst of knee, left 05/08/2019  . Left knee pain 05/08/2019  . Infected cyst of skin 11/30/2018  . Neuralgia of flank, left 11/30/2018  . Perimenopausal symptoms 10/26/2018  . Obesity 10/26/2018  . Mild persistent asthma 08/02/2018  . Fatty liver 12/11/2017  . Allergic conjunctivitis 07/26/2017  . Gastroesophageal reflux disease 08/03/2016  . At risk of diabetes mellitus 08/03/2016  . Recurrent urticaria 05/11/2016  . Food allergy 05/11/2016  . Rash and nonspecific skin eruption 03/17/2016  . Right shoulder pain 08/02/2015  . Perennial and seasonal allergic rhinitis 03/20/2015  . Vitamin D deficiency 03/20/2015  . Eczema 03/20/2015    Beaulah Dinning, PT, DPT 04/24/20 12:53 PM  [facility] [address] [place], [place], [address] Phone: [phone]   Fax:  [phone]  Name: Kathleen Ortiz MRN: [id number] Date of Birth: 06/28/1970

## 2020-04-29 NOTE — Progress Notes (Signed)
[facility] [address] [place] [address] Phone: [phone] Subjective:   Bruce Donath, am serving as a scribe for Dr. Antoine Primas. This visit occurred during the SARS-CoV-2 public health emergency.  Safety protocols were in place, including screening questions prior to the visit, additional usage of staff PPE, and extensive cleaning of exam room while observing appropriate contact time as indicated for disinfecting solutions.   I'm seeing this patient by the request  of:  Pincus Sanes, MD  CC: Back pain and hip pain follow-up  [id number]  Kathleen Ortiz is a 50 y.o. female coming in with complaint of back and neck pain. OMT 03/19/2020. Patient states she is going to PT. Patient states that hip pain is gone. Does feel like PT is helping. Continues to have pain in the SI joints and muscle spasms in the glutes.  Patient has made some progress.  Would state that 80% better at this time.  Did feel that the manipulation has been helpful.  Continues to have some mild spasms that occur.  Usually happens daily but only last minutes.  Taking the vitamin supplementations including the vitamin D and iron at this time.  Denies any abdominal pain at this moment.         Reviewed prior external information including notes and imaging from previsou exam, outside providers and external EMR if available.   As well as notes that were available from care everywhere and other healthcare systems.  Past medical history, social, surgical and family history all reviewed in electronic medical record.  No pertanent information unless stated regarding to the chief complaint.   Past Medical History:  Diagnosis Date  . Chronic bronchitis ([facility])   . Eczema   . GERD (gastroesophageal reflux disease)   . Mild persistent asthma 08/02/2018  . Urticaria     Allergies  Allergen Reactions  . Doxycycline Other (See Comments)    headache  . Shellfish Allergy   .  Latex Rash     Review of Systems:  No headache, visual changes, nausea, vomiting, diarrhea, constipation, dizziness, abdominal pain, skin rash, fevers, chills, night sweats, weight loss, swollen lymph nodes, body aches, joint swelling, chest pain, shortness of breath, mood changes. POSITIVE muscle aches  Objective  Blood pressure 138/80, pulse 92, height 5\' 2"  (1.575 m), weight 174 lb (78.9 kg), last menstrual period 04/30/2020, SpO2 97 %.   General: No apparent distress alert and oriented x3 mood and affect normal, dressed appropriately.  HEENT: Pupils equal, extraocular movements intact  Respiratory: Patient's speak in full sentences and does not appear short of breath  Cardiovascular: No lower extremity edema, non tender, no erythema  Neuro: Cranial nerves II through XII are intact, neurovascularly intact in all extremities with 2+ DTRs and 2+ pulses.  Gait normal with good balance and coordination.  MSK:  Non tender with full range of motion and good stability and symmetric strength and tone of shoulders, elbows, wrist, hip, knee and ankles bilaterally.  Back -low back exam does have some tightness noted around the sacroiliac joint left greater than right today.  Osteopathic findings  Sacrum left on left       Assessment and Plan:  Chronic bilateral low back pain with sciatica Continues to have intermittent cramping and some blood.  Patient was found to have low vitamin D and some mild low iron.  Given short course again of Zanaflex which is refilled.  Responded decently to osteopathic manipulation and  continue to work with physical therapy.  X-rays of the lumbar ordered today to further evaluate.  If continuing to have difficulty I would like to consider the possibility of MRI of the back.  Follow-up again in 2 months    Nonallopathic problems  Decision today to treat with OMT was based on Physical Exam  After verbal consent patient was treated with HVLA, ME, FPR techniques  in  sacral  areas  Patient tolerated the procedure well with improvement in symptoms  Patient given exercises, stretches and lifestyle modifications  See medications in patient instructions if given  Patient will follow up in 8 weeks      The above documentation has been reviewed and is accurate and complete Judi Saa, DO       Note: This dictation was prepared with Dragon dictation along with smaller phrase technology. Any transcriptional errors that result from this process are unintentional.

## 2020-04-30 ENCOUNTER — Encounter: Payer: Self-pay | Admitting: Family Medicine

## 2020-04-30 ENCOUNTER — Ambulatory Visit (INDEPENDENT_AMBULATORY_CARE_PROVIDER_SITE_OTHER): Payer: 59 | Admitting: Family Medicine

## 2020-04-30 ENCOUNTER — Other Ambulatory Visit: Payer: Self-pay

## 2020-04-30 ENCOUNTER — Ambulatory Visit (INDEPENDENT_AMBULATORY_CARE_PROVIDER_SITE_OTHER): Payer: 59

## 2020-04-30 VITALS — BP 138/80 | HR 92 | Ht 62.0 in | Wt 174.0 lb

## 2020-04-30 DIAGNOSIS — M5440 Lumbago with sciatica, unspecified side: Secondary | ICD-10-CM

## 2020-04-30 DIAGNOSIS — M999 Biomechanical lesion, unspecified: Secondary | ICD-10-CM

## 2020-04-30 DIAGNOSIS — M545 Low back pain, unspecified: Secondary | ICD-10-CM | POA: Diagnosis not present

## 2020-04-30 DIAGNOSIS — G8929 Other chronic pain: Secondary | ICD-10-CM

## 2020-04-30 MED ORDER — TIZANIDINE HCL 2 MG PO TABS: 2.0000 mg | ORAL_TABLET | Freq: Every day | ORAL | 0 refills | Status: DC

## 2020-04-30 NOTE — Assessment & Plan Note (Signed)
Continues to have intermittent cramping and some blood.  Patient was found to have low vitamin D and some mild low iron.  Given short course again of Zanaflex which is refilled.  Responded decently to osteopathic manipulation and continue to work with physical therapy.  X-rays of the lumbar ordered today to further evaluate.  If continuing to have difficulty I would like to consider the possibility of MRI of the back.  Follow-up again in 2 months

## 2020-04-30 NOTE — Patient Instructions (Signed)
Zanaflex refilled Keep taking vitamins Continue PT Back xray today See me again in 2 months

## 2020-05-01 ENCOUNTER — Other Ambulatory Visit: Payer: Self-pay

## 2020-05-01 ENCOUNTER — Ambulatory Visit: Payer: 59 | Admitting: Physical Therapy

## 2020-05-01 ENCOUNTER — Encounter: Payer: Self-pay | Admitting: Physical Therapy

## 2020-05-01 DIAGNOSIS — M62830 Muscle spasm of back: Secondary | ICD-10-CM

## 2020-05-01 DIAGNOSIS — M6281 Muscle weakness (generalized): Secondary | ICD-10-CM

## 2020-05-01 DIAGNOSIS — R293 Abnormal posture: Secondary | ICD-10-CM

## 2020-05-01 DIAGNOSIS — M545 Low back pain, unspecified: Secondary | ICD-10-CM | POA: Diagnosis not present

## 2020-05-01 DIAGNOSIS — M25562 Pain in left knee: Secondary | ICD-10-CM

## 2020-05-01 NOTE — Therapy (Signed)
[place], [place], [address] Phone: [phone]   Fax:  [phone]  Physical Therapy Treatment  Patient Details  Name: Kathleen Ortiz MRN: [id number] Date of Birth: [birth year] Referring Provider (PT): Hulan Saas, DO   Encounter Date: 05/01/2020   PT End of Session - 05/01/20 1233    Visit Number 3    Number of Visits 12    Date for PT Re-Evaluation 05/10/20    Authorization Type [employer]    PT Start Time 1137    PT Stop Time 1227    PT Time Calculation (min) 50 min    Activity Tolerance Patient tolerated treatment well    Behavior During Therapy [facility] for tasks assessed/performed           Past Medical History:  Diagnosis Date  . Chronic bronchitis ([place])   . Eczema   . GERD (gastroesophageal reflux disease)   . Mild persistent asthma 08/02/2018  . Urticaria     Past Surgical History:  Procedure Laterality Date  . TYMPANOSTOMY TUBE PLACEMENT      There were no vitals filed for this visit.   Subjective Assessment - 05/01/20 1159    Subjective Pt. had follow up with Dr. Tamala Julian yesterday and plan is to continue PT. Dr. Tamala Julian performed spinal manipulation with pt. noting benefit from this (discussed PT able to perform as well but pt. deferred for today). She notes some exacerbation of back pain associated with menstrual cramps with 5/10 pain this AM in sacral region. Pt. also had X-rays performed of lumbar spine which per report showed mild degeneratove changess otherwise (-).    Currently in Pain? Yes    Pain Score 5     Pain Location Back    Pain Orientation Left;Right    Pain Descriptors / Indicators Aching    Pain Type Chronic pain    Pain Onset More than a month ago    Pain Frequency Constant    Aggravating Factors  "not moving"    Pain Relieving Factors exercises/PT, spinal manipulation                             [facility] Adult PT Treatment/Exercise -  05/01/20 0001      Lumbar Exercises: Stretches   Piriformis Stretch Left;3 reps;30 seconds    Figure 4 Stretch 3 reps;30 seconds      Lumbar Exercises: Standing   Other Standing Lumbar Exercises monster walk with green band at ankles 20 feet x 2      Lumbar Exercises: Supine   Clam 20 reps    Clam Limitations green band    Bent Knee Raise 15 reps    Bent Knee Raise Limitations with posterior pelvic tilt    Bridge --   see below   Bridge Limitations x 15 with ball squeeze and x 10 with clamshell with green band      Manual Therapy   Manual Therapy Muscle Energy Technique    Joint Mobilization PA mobilization grade I-III to left ASIS, attempted LAD left hip again but held due to c/o anterior hip pain    Soft tissue mobilization STM/IASTM left lumbosacral region and foam roll left piriformis    Muscle Energy Technique "shotgun" MET from hooklying 3 x 5 sec holds                  PT Education - 05/01/20 1233  Education Details HEP updates    Person(s) Educated Patient    Methods Explanation    Comprehension Verbalized understanding            PT Short Term Goals - 03/28/20 1159      PT SHORT TERM GOAL #1   Title She will be indpendnet with initial HEP    Time 3    Period Weeks    Status New      PT SHORT TERM GOAL #2   Title Pain decreased 20% or more    Time 3    Period Weeks    Status New      PT SHORT TERM GOAL #3   Title FOTO score improved to 52%    Time 3    Period Weeks    Status New      PT SHORT TERM GOAL #4   Title RT patella pain decr 25% or more    Time 3    Period Weeks    Status New             PT Long Term Goals - 03/28/20 1200      PT LONG TERM GOAL #1   Title She will be indpendnet with all HEP issued    Time 6    Period Weeks    Status New      PT LONG TERM GOAL #2   Title Pain decreased 50% or more  in LT lower back    Time 6    Period Weeks    Status New      PT LONG TERM GOAL #3   Title FOTO score improved to   60%    Time 6    Period Weeks    Status New      PT LONG TERM GOAL #4   Title She will report able to get comfortable to sleep and decr postion changes to 2-3per night    Time 6    Period Weeks    Status New      PT LONG TERM GOAL #5   Title She will report no knee pain with home activity    Time 6    Period Weeks    Status New                 Plan - 05/01/20 1234    Clinical Impression Statement For manual therapy continued directional preference noted for posterior innominate rotation (PA mob to left ASIS) so included for pain relief with good response. Attempted LAD left hip but continued pain with this so held/plan hold at future sessions. Performed MET for SIJ as alternative to spinal manipulation and otherwise focus exercise progression for lumbopelvic and hip stabilization as noted per flowsheet. Still with moderate pain level but noting improvement with therapy so plan continue PT for further progress to relieve pain and address associated functional limitations.    Personal Factors and Comorbidities Time since onset of injury/illness/exacerbation;Past/Current Experience    Examination-Activity Limitations Squat;Locomotion Level;Bend;Sleep;Lift    Examination-Participation Restrictions Community Activity;Shop;Cleaning    Stability/Clinical Decision Making Stable/Uncomplicated    Clinical Decision Making Low    Rehab Potential Good    PT Frequency 2x / week    PT Duration 6 weeks    PT Treatment/Interventions Electrical Stimulation;Moist Heat;Therapeutic activities;Therapeutic exercise;Manual techniques;Patient/family education;Passive range of motion;Dry needling;Taping    PT Next Visit Plan continue manual therapy for AP mobs left ASIS, STM as needed, continue hip and lumbopelvic stabilization progression as  tolerated, piriformis and glut stretches as needed    PT Home Exercise Plan piriformis stretch ,, SKC isometrics for hip extension (MET), 04/24/20 added posterior  pelvic tilt, clamshell, hip bridge with clamshell, self left posterior innominate rotation with left leg in figure 4 position in sitting, monster walk    Consulted and Agree with Plan of Care Patient           Patient will benefit from skilled therapeutic intervention in order to improve the following deficits and impairments:  Pain,Postural dysfunction,Increased muscle spasms,Decreased range of motion,Decreased activity tolerance,Decreased strength  Visit Diagnosis: Chronic bilateral low back pain, unspecified whether sciatica present  Acute pain of left knee  Abnormal posture  Muscle spasm of back  Muscle weakness (generalized)     Problem List Patient Active Problem List   Diagnosis Date Noted  . COVID-19 04/11/2020  . Nonallopathic lesion of sacral region 03/19/2020  . Chronic bilateral low back pain with sciatica 03/08/2020  . Hearing loss 11/23/2019  . Baker's cyst of knee, left 05/08/2019  . Left knee pain 05/08/2019  . Infected cyst of skin 11/30/2018  . Neuralgia of flank, left 11/30/2018  . Perimenopausal symptoms 10/26/2018  . Obesity 10/26/2018  . Mild persistent asthma 08/02/2018  . Fatty liver 12/11/2017  . Allergic conjunctivitis 07/26/2017  . Gastroesophageal reflux disease 08/03/2016  . At risk of diabetes mellitus 08/03/2016  . Recurrent urticaria 05/11/2016  . Food allergy 05/11/2016  . Rash and nonspecific skin eruption 03/17/2016  . Right shoulder pain 08/02/2015  . Perennial and seasonal allergic rhinitis 03/20/2015  . Vitamin D deficiency 03/20/2015  . Eczema 03/20/2015    Beaulah Dinning, PT, DPT 05/01/20 12:38 PM  [place] [facility] [address] [place], [place], [address] Phone: [phone]   Fax:  [phone]  Name: Kathleen Ortiz MRN: [id number] Date of Birth: May 03, 1970

## 2020-05-10 ENCOUNTER — Other Ambulatory Visit: Payer: Self-pay | Admitting: Internal Medicine

## 2020-05-17 ENCOUNTER — Other Ambulatory Visit: Payer: Self-pay

## 2020-05-17 ENCOUNTER — Encounter: Payer: Self-pay | Admitting: Physical Therapy

## 2020-05-17 ENCOUNTER — Ambulatory Visit: Payer: 59 | Attending: Internal Medicine | Admitting: Physical Therapy

## 2020-05-17 DIAGNOSIS — M25562 Pain in left knee: Secondary | ICD-10-CM | POA: Insufficient documentation

## 2020-05-17 DIAGNOSIS — R293 Abnormal posture: Secondary | ICD-10-CM | POA: Insufficient documentation

## 2020-05-17 DIAGNOSIS — M6281 Muscle weakness (generalized): Secondary | ICD-10-CM | POA: Insufficient documentation

## 2020-05-17 DIAGNOSIS — M545 Low back pain, unspecified: Secondary | ICD-10-CM | POA: Diagnosis present

## 2020-05-17 DIAGNOSIS — M62830 Muscle spasm of back: Secondary | ICD-10-CM | POA: Diagnosis present

## 2020-05-17 DIAGNOSIS — G8929 Other chronic pain: Secondary | ICD-10-CM | POA: Insufficient documentation

## 2020-05-17 NOTE — Therapy (Signed)
[place], [place], [address] Phone: [phone]   Fax:  [phone]  Physical Therapy Treatment / Re-certification  Patient Details  Name: Kathleen Ortiz MRN: [id number] Date of Birth: 04-22-1970 Referring Provider (PT): Hulan Saas, DO   Encounter Date: 05/17/2020   PT End of Session - 05/17/20 0843    Visit Number 4    Number of Visits 12    Date for PT Re-Evaluation 06/14/20    Authorization Type [employer]    PT Start Time 0801    PT Stop Time 0843    PT Time Calculation (min) 42 min    Activity Tolerance Patient tolerated treatment well    Behavior During Therapy [facility] for tasks assessed/performed           Past Medical History:  Diagnosis Date  . Chronic bronchitis ([place])   . Eczema   . GERD (gastroesophageal reflux disease)   . Mild persistent asthma 08/02/2018  . Urticaria     Past Surgical History:  Procedure Laterality Date  . TYMPANOSTOMY TUBE PLACEMENT      There were no vitals filed for this visit.   Subjective Assessment - 05/17/20 0805    Subjective pt reports the hip is doing much better. The biggest issue is when she is laying down especially on the L side.    Currently in Pain? Yes    Pain Score 4     Pain Location Hip    Pain Orientation Left    Pain Descriptors / Indicators Aching    Pain Type Chronic pain    Pain Onset More than a month ago    Pain Frequency Intermittent    Aggravating Factors  laying down on the L hip, prolonged walking/ standing    Pain Relieving Factors exercises,              [facility] PT Assessment - 05/17/20 0001      Assessment   Medical Diagnosis poly arthalgia    Referring Provider (PT) Hulan Saas, DO      Special Tests    Special Tests Leg LengthTest    Leg length test  True;Apparent      True   Right 89.1 in.    Left  87.6 in.      Apparent   Right 99.9 in.    Left 98.8 in.                          [place] Adult PT Treatment/Exercise - 05/17/20 0001      Self-Care   Self-Care Other Self-Care Comments    Other Self-Care Comments  Heel lift instructsion and length of wear      Lumbar Exercises: Stretches   Hip Flexor Stretch 3 reps;Left;30 seconds;Other (comment)   standing with glute squeeze     Knee/Hip Exercises: Standing   Gait Training walking 4 x 30 ft with heel lift in the L shoe    Other Standing Knee Exercises hamstring curl 2 x 10 with green band      Knee/Hip Exercises: Seated   Hamstring Curl Strengthening;Left;2 sets;10 reps   green band                 PT Education - 05/17/20 0843    Education Details reviewed HEP an udpated today for hip flexor stretcing and hamstring activation. provided heel lift  for L shoe after LLD assessment, and explained results  PT Short Term Goals - 05/17/20 0810      PT SHORT TERM GOAL #1   Title She will be indpendnet with initial HEP    Period Weeks    Status Achieved      PT SHORT TERM GOAL #2   Title Pain decreased 20% or more    Period Weeks    Status Achieved      PT SHORT TERM GOAL #3   Title FOTO score improved to 52%    Period Weeks    Status Unable to assess      PT SHORT TERM GOAL #4   Title RT patella pain decr 25% or more    Status Achieved             PT Long Term Goals - 05/17/20 0737      PT LONG TERM GOAL #1   Title She will be indpendnet with all HEP issued    Period Weeks    Status On-going      PT LONG TERM GOAL #2   Title Pain decreased 50% or more  in LT lower back    Period Weeks    Status On-going      PT LONG TERM GOAL #3   Title FOTO score improved to  60%    Period Weeks    Status Unable to assess      PT LONG TERM GOAL #4   Title She will report able to get comfortable to sleep and decr postion changes to 2-3per night    Period Weeks    Status On-going      PT LONG TERM GOAL #5   Title She will report no knee pain with  home activity    Period Weeks    Status On-going                 Plan - 05/17/20 0844    Clinical Impression Statement pt reports improvement in pain since staring physical therapy but reports continued L hip pain espeically wiht sidelying. further assessment reveaeld true and apparent LLD with the LLE being short and compensating with and anterior hip rotation. provided heel lift for the left shoe and pt noted significant difference with walking/ standing. continued working on hip flexor stretch and hamstring activation to address rotation. she is making good progress and would benefit from continued physical therapy to decreaes L hip / back pain, improve walking/ standing endurance and maximize her function.    PT Frequency 2x / week    PT Duration 4 weeks    PT Treatment/Interventions Electrical Stimulation;Moist Heat;Therapeutic activities;Therapeutic exercise;Manual techniques;Patient/family education;Passive range of motion;Dry needling;Taping    PT Next Visit Plan continue manual therapy for AP mobs left ASIS, STM as for glute med on L, continue hip and lumbopelvic stabilization progression as tolerated, piriformis and glut stretches as needed, hows heel lift,    PT Home Exercise Plan piriformis stretch ,, SKC isometrics for hip extension (MET), 04/24/20 added posterior pelvic tilt, clamshell, hip bridge with clamshell, self left posterior innominate rotation with left leg in figure 4 position in sitting, monster walk, hp flexor stretch and hamstring strengtheing.    Consulted and Agree with Plan of Care Patient           Patient will benefit from skilled therapeutic intervention in order to improve the following deficits and impairments:  Pain,Postural dysfunction,Increased muscle spasms,Decreased range of motion,Decreased activity tolerance,Decreased strength  Visit Diagnosis: Chronic bilateral low back pain, unspecified whether sciatica  present  Acute pain of left  knee  Abnormal posture  Muscle spasm of back  Muscle weakness (generalized)     Problem List Patient Active Problem List   Diagnosis Date Noted  . COVID-19 04/11/2020  . Nonallopathic lesion of sacral region 03/19/2020  . Chronic bilateral low back pain with sciatica 03/08/2020  . Hearing loss 11/23/2019  . Baker's cyst of knee, left 05/08/2019  . Left knee pain 05/08/2019  . Infected cyst of skin 11/30/2018  . Neuralgia of flank, left 11/30/2018  . Perimenopausal symptoms 10/26/2018  . Obesity 10/26/2018  . Mild persistent asthma 08/02/2018  . Fatty liver 12/11/2017  . Allergic conjunctivitis 07/26/2017  . Gastroesophageal reflux disease 08/03/2016  . At risk of diabetes mellitus 08/03/2016  . Recurrent urticaria 05/11/2016  . Food allergy 05/11/2016  . Rash and nonspecific skin eruption 03/17/2016  . Right shoulder pain 08/02/2015  . Perennial and seasonal allergic rhinitis 03/20/2015  . Vitamin D deficiency 03/20/2015  . Eczema 03/20/2015    Starr Lake PT, DPT, LAT, ATC  05/17/20  9:04 AM      [facility] [address] [place], [place], [address] Phone: [phone]   Fax:  [phone]  Name: BILLI BRIGHT MRN: [id number] Date of Birth: 18-Sep-1970

## 2020-05-24 ENCOUNTER — Ambulatory Visit: Payer: 59 | Admitting: Physical Therapy

## 2020-05-31 ENCOUNTER — Ambulatory Visit: Payer: 59 | Admitting: Physical Therapy

## 2020-05-31 ENCOUNTER — Other Ambulatory Visit: Payer: Self-pay

## 2020-05-31 ENCOUNTER — Encounter: Payer: Self-pay | Admitting: Physical Therapy

## 2020-05-31 DIAGNOSIS — M62830 Muscle spasm of back: Secondary | ICD-10-CM

## 2020-05-31 DIAGNOSIS — G8929 Other chronic pain: Secondary | ICD-10-CM

## 2020-05-31 DIAGNOSIS — R293 Abnormal posture: Secondary | ICD-10-CM

## 2020-05-31 DIAGNOSIS — M25562 Pain in left knee: Secondary | ICD-10-CM

## 2020-05-31 DIAGNOSIS — M6281 Muscle weakness (generalized): Secondary | ICD-10-CM

## 2020-05-31 DIAGNOSIS — M545 Low back pain, unspecified: Secondary | ICD-10-CM | POA: Diagnosis not present

## 2020-05-31 NOTE — Therapy (Signed)
[place], [place], [address] Phone: [phone]   Fax:  [phone]  Physical Therapy Treatment  Patient Details  Name: Kathleen Ortiz MRN: [id number] Date of Birth: [birth year] Referring Provider (PT): Hulan Saas, DO   Encounter Date: 05/31/2020   PT End of Session - 05/31/20 1130    Visit Number 5    Number of Visits 12    Date for PT Re-Evaluation 06/14/20    Authorization Type [employer]    PT Start Time 1100    PT Stop Time 1140    PT Time Calculation (min) 40 min    Activity Tolerance Patient tolerated treatment well    Behavior During Therapy [facility] for tasks assessed/performed           Past Medical History:  Diagnosis Date  . Chronic bronchitis ([place])   . Eczema   . GERD (gastroesophageal reflux disease)   . Mild persistent asthma 08/02/2018  . Urticaria     Past Surgical History:  Procedure Laterality Date  . TYMPANOSTOMY TUBE PLACEMENT      There were no vitals filed for this visit.   Subjective Assessment - 05/31/20 1125    Subjective Patient is not having any pain today. She reports that throughout the weeks she has had some pain. She can not recall a cause.    Limitations House hold activities    Diagnostic tests Xray,  MRI:    Currently in Pain? No/denies                             [facility] Adult PT Treatment/Exercise - 05/31/20 0001      Lumbar Exercises: Stretches   Piriformis Stretch Left;3 reps;30 seconds    Figure 4 Stretch 3 reps;30 seconds      Lumbar Exercises: Standing   Other Standing Lumbar Exercises Hip 3 way x10 each leg; heel raise x20      Lumbar Exercises: Supine   Pelvic Tilt 15 reps    Clam 20 reps    Clam Limitations green band    Other Supine Lumbar Exercises hip adduction isometric with ball 5 sec x 15 reps                  PT Education - 05/31/20 1129    Education Details reviewed standing series of exercises     Person(s) Educated Patient    Methods Explanation;Demonstration;Tactile cues;Verbal cues    Comprehension Verbalized understanding;Returned demonstration;Verbal cues required;Tactile cues required            PT Short Term Goals - 05/17/20 0810      PT SHORT TERM GOAL #1   Title She will be indpendnet with initial HEP    Period Weeks    Status Achieved      PT SHORT TERM GOAL #2   Title Pain decreased 20% or more    Period Weeks    Status Achieved      PT SHORT TERM GOAL #3   Title FOTO score improved to 52%    Period Weeks    Status Unable to assess      PT SHORT TERM GOAL #4   Title RT patella pain decr 25% or more    Status Achieved             PT Long Term Goals - 05/17/20 0811      PT LONG TERM GOAL #1  Title She will be indpendnet with all HEP issued    Period Weeks    Status On-going      PT LONG TERM GOAL #2   Title Pain decreased 50% or more  in LT lower back    Period Weeks    Status On-going      PT LONG TERM GOAL #3   Title FOTO score improved to  60%    Period Weeks    Status Unable to assess      PT LONG TERM GOAL #4   Title She will report able to get comfortable to sleep and decr postion changes to 2-3per night    Period Weeks    Status On-going      PT LONG TERM GOAL #5   Title She will report no knee pain with home activity    Period Weeks    Status On-going                 Plan - 05/31/20 1132    Clinical Impression Statement Patient is doing well. She had no increase in pain with ther-ex. She was given a stand derioes of exercises for home. She tolerated well. She is making good progreess. She plans on going back to the gym soon. She was advised she can do the bike and any machines that dont cause her pain,    Personal Factors and Comorbidities Time since onset of injury/illness/exacerbation;Past/Current Experience    Examination-Activity Limitations Squat;Locomotion Level;Bend;Sleep;Lift    Examination-Participation  Restrictions Community Activity;Shop;Cleaning    Stability/Clinical Decision Making Stable/Uncomplicated    Clinical Decision Making Low    Rehab Potential Good    PT Frequency 2x / week    PT Duration 4 weeks    PT Treatment/Interventions Electrical Stimulation;Moist Heat;Therapeutic activities;Therapeutic exercise;Manual techniques;Patient/family education;Passive range of motion;Dry needling;Taping    PT Next Visit Plan continue manual therapy for AP mobs left ASIS, STM as for glute med on L, continue hip and lumbopelvic stabilization progression as tolerated, piriformis and glut stretches as needed, hows heel lift, reviewe tolerance to HEP    PT Home Exercise Plan piriformis stretch ,, SKC isometrics for hip extension (MET), 04/24/20 added posterior pelvic tilt, clamshell, hip bridge with clamshell, self left posterior innominate rotation with left leg in figure 4 position in sitting, monster walk, hp flexor stretch and hamstring strengtheing.    Consulted and Agree with Plan of Care Patient           Patient will benefit from skilled therapeutic intervention in order to improve the following deficits and impairments:  Pain,Postural dysfunction,Increased muscle spasms,Decreased range of motion,Decreased activity tolerance,Decreased strength  Visit Diagnosis: Chronic bilateral low back pain, unspecified whether sciatica present  Acute pain of left knee  Abnormal posture  Muscle spasm of back  Muscle weakness (generalized)     Problem List Patient Active Problem List   Diagnosis Date Noted  . COVID-19 04/11/2020  . Nonallopathic lesion of sacral region 03/19/2020  . Chronic bilateral low back pain with sciatica 03/08/2020  . Hearing loss 11/23/2019  . Baker's cyst of knee, left 05/08/2019  . Left knee pain 05/08/2019  . Infected cyst of skin 11/30/2018  . Neuralgia of flank, left 11/30/2018  . Perimenopausal symptoms 10/26/2018  . Obesity 10/26/2018  . Mild persistent  asthma 08/02/2018  . Fatty liver 12/11/2017  . Allergic conjunctivitis 07/26/2017  . Gastroesophageal reflux disease 08/03/2016  . At risk of diabetes mellitus 08/03/2016  . Recurrent urticaria 05/11/2016  .  Food allergy 05/11/2016  . Rash and nonspecific skin eruption 03/17/2016  . Right shoulder pain 08/02/2015  . Perennial and seasonal allergic rhinitis 03/20/2015  . Vitamin D deficiency 03/20/2015  . Eczema 03/20/2015    Carney Living PT DPT  05/31/2020, 11:49 AM  [facility] [address] [place], [place], [address] Phone: [phone]   Fax:  [phone]  Name: Kathleen Ortiz MRN: [id number] Date of Birth: Apr 08, 1970

## 2020-06-06 ENCOUNTER — Encounter: Payer: Self-pay | Admitting: Physical Therapy

## 2020-06-06 ENCOUNTER — Ambulatory Visit: Payer: 59 | Attending: Internal Medicine | Admitting: Physical Therapy

## 2020-06-06 ENCOUNTER — Other Ambulatory Visit: Payer: Self-pay

## 2020-06-06 DIAGNOSIS — R293 Abnormal posture: Secondary | ICD-10-CM | POA: Insufficient documentation

## 2020-06-06 DIAGNOSIS — M62830 Muscle spasm of back: Secondary | ICD-10-CM | POA: Diagnosis present

## 2020-06-06 DIAGNOSIS — M545 Low back pain, unspecified: Secondary | ICD-10-CM | POA: Diagnosis present

## 2020-06-06 DIAGNOSIS — M25562 Pain in left knee: Secondary | ICD-10-CM | POA: Diagnosis present

## 2020-06-06 DIAGNOSIS — G8929 Other chronic pain: Secondary | ICD-10-CM

## 2020-06-06 DIAGNOSIS — M6281 Muscle weakness (generalized): Secondary | ICD-10-CM | POA: Insufficient documentation

## 2020-06-06 NOTE — Therapy (Signed)
[place], [place], [address] Phone: [phone]   Fax:  [phone]  Physical Therapy Treatment  Patient Details  Name: Kathleen Ortiz MRN: [id number] Date of Birth: Oct 20, 1970 Referring Provider (PT): Hulan Saas, DO   Encounter Date: 06/06/2020   PT End of Session - 06/06/20 1214    Visit Number 6    Number of Visits 12    Date for PT Re-Evaluation 06/14/20    Authorization Type [employer]    PT Start Time 1102    PT Stop Time 1146    PT Time Calculation (min) 44 min    Activity Tolerance Patient tolerated treatment well    Behavior During Therapy [facility] for tasks assessed/performed           Past Medical History:  Diagnosis Date  . Chronic bronchitis ([place])   . Eczema   . GERD (gastroesophageal reflux disease)   . Mild persistent asthma 08/02/2018  . Urticaria     Past Surgical History:  Procedure Laterality Date  . TYMPANOSTOMY TUBE PLACEMENT      There were no vitals filed for this visit.   Subjective Assessment - 06/06/20 1131    Subjective Primary pain today is left lateral hip region 3-4/10 this AM otherwise no new complaints/concerns since last visit.              [facility] PT Assessment - 06/06/20 0001      Observation/Other Assessments   Focus on Therapeutic Outcomes (FOTO)  52% function      Palpation   Palpation comment TTP left greater trochanteroc region and gluteus medius with trigger points                         [facility] Adult PT Treatment/Exercise - 06/06/20 0001      Lumbar Exercises: Stretches   Piriformis Stretch Left;3 reps;30 seconds    Piriformis Stretch Limitations left gluteal stretch    Other Lumbar Stretch Exercise left manual adductor stretch 3x30 sec      Lumbar Exercises: Standing   Shoulder Extension AROM;Strengthening;Both;15 reps    Theraband Level (Shoulder Extension) Level 4 (Blue)    Other Standing Lumbar Exercises hip hike  with left foot on 4 in. step x 10 reps    Other Standing Lumbar Exercises pall off press with blue band x 15 reps ea. direction bilat.      Lumbar Exercises: Supine   Clam 20 reps    Clam Limitations blue band    Bridge with clamshell 20 reps   blue band     Manual Therapy   Joint Mobilization AP mob grade II-II to left ASIS    Soft tissue mobilization STM left lateral hip-gluteus medius region                  PT Education - 06/06/20 1213    Education Details HEP updates, potential symptom etiology for lateral hip pain/greater trochanteric pain syndrome    Person(s) Educated Patient    Methods Explanation;Demonstration;Verbal cues;Handout    Comprehension Verbalized understanding;Returned demonstration            PT Short Term Goals - 05/17/20 0810      PT SHORT TERM GOAL #1   Title She will be indpendnet with initial HEP    Period Weeks    Status Achieved      PT SHORT TERM GOAL #2   Title Pain decreased 20% or  more    Period Weeks    Status Achieved      PT SHORT TERM GOAL #3   Title FOTO score improved to 52%    Period Weeks    Status Unable to assess      PT SHORT TERM GOAL #4   Title RT patella pain decr 25% or more    Status Achieved             PT Long Term Goals - 06/06/20 1216      PT LONG TERM GOAL #1   Title She will be independent with all HEP issued    Baseline updates ongoing    Time 6    Period Weeks    Status On-going      PT LONG TERM GOAL #2   Title Pain decreased 50% or more  in LT lower back    Time 6    Period Weeks    Status On-going      PT LONG TERM GOAL #3   Title FOTO score improved to  60%    Baseline 52% 06/06/20    Time 6    Period Weeks    Status On-going      PT LONG TERM GOAL #4   Title She will report able to get comfortable to sleep and decr postion changes to 2-3per night    Time 6    Period Weeks    Status On-going      PT LONG TERM GOAL #5   Title She will report no knee pain with home activity     Time 6    Period Weeks    Status On-going                 Plan - 06/06/20 1218    Clinical Impression Statement Primary pain today in left lateral hip as noted in subjective consistent with 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 Discussed potential dry needling but pt. wished to defer for today/may consider next session so focus manual therapy and strengthening and stretches to address with good tolerance. Rechecked FOTO with 6 % gain noted from baseline-still with difficulty in particular with prolonged walking and more involved chores.    Personal Factors and Comorbidities Time since onset of injury/illness/exacerbation;Past/Current Experience    Examination-Activity Limitations Squat;Locomotion Level;Bend;Sleep;Lift    Examination-Participation Restrictions Community Activity;Shop;Cleaning    Stability/Clinical Decision Making Stable/Uncomplicated    Clinical Decision Making Low    Rehab Potential Good    PT Frequency 2x / week    PT Duration 4 weeks    PT Treatment/Interventions Electrical Stimulation;Moist Heat;Therapeutic activities;Therapeutic exercise;Manual techniques;Patient/family education;Passive range of motion;Dry needling;Taping    PT Next Visit Plan ERO vs. d/c next session pending status, potential trial dry needling left gluteus medius region, progress/update HEP as needed, continue lumbopelvic stabilization and hip strengtheing and stretches as tolerated    PT Home Exercise Plan piriformis stretch ,, SKC isometrics for hip extension (MET), 04/24/20 added posterior pelvic tilt, clamshell, hip bridge with clamshell, self left posterior innominate rotation with left leg in figure 4 position in sitting, monster walk, hp flexor stretch and hamstring strengthening, gluteal and adductor stretches    Consulted and Agree with Plan of Care Patient           Patient will benefit from skilled therapeutic  intervention in order to improve the following deficits and impairments:  Pain,Postural dysfunction,Increased muscle spasms,Decreased range of motion,Decreased activity tolerance,Decreased strength  Visit  Diagnosis: Chronic bilateral low back pain, unspecified whether sciatica present  Acute pain of left knee  Abnormal posture  Muscle spasm of back  Muscle weakness (generalized)     Problem List Patient Active Problem List   Diagnosis Date Noted  . COVID-19 04/11/2020  . Nonallopathic lesion of sacral region 03/19/2020  . Chronic bilateral low back pain with sciatica 03/08/2020  . Hearing loss 11/23/2019  . Baker's cyst of knee, left 05/08/2019  . Left knee pain 05/08/2019  . Infected cyst of skin 11/30/2018  . Neuralgia of flank, left 11/30/2018  . Perimenopausal symptoms 10/26/2018  . Obesity 10/26/2018  . Mild persistent asthma 08/02/2018  . Fatty liver 12/11/2017  . Allergic conjunctivitis 07/26/2017  . Gastroesophageal reflux disease 08/03/2016  . At risk of diabetes mellitus 08/03/2016  . Recurrent urticaria 05/11/2016  . Food allergy 05/11/2016  . Rash and nonspecific skin eruption 03/17/2016  . Right shoulder pain 08/02/2015  . Perennial and seasonal allergic rhinitis 03/20/2015  . Vitamin D deficiency 03/20/2015  . Eczema 03/20/2015    Beaulah Dinning, PT, DPT 06/06/20 12:32 PM  [facility] [address] [place], [place], [address] Phone: [phone]   Fax:  [phone]  Name: Kathleen Ortiz MRN: [id number] Date of Birth: [birth year]

## 2020-06-13 ENCOUNTER — Ambulatory Visit: Payer: 59 | Admitting: Physical Therapy

## 2020-06-24 ENCOUNTER — Telehealth: Payer: Self-pay

## 2020-06-24 NOTE — Telephone Encounter (Signed)
Patient called stating she has been itching/scratching and leaving marks on her body since the season changed. She is requesting Hydroquinone Cream for the itching.  Patient also wants to know if she can double up on her Levocetrizine?   Please Advise [address]

## 2020-06-24 NOTE — Telephone Encounter (Signed)
Called and spoke to patient and informed her of the note per Dr.Gallagher. Patient was not pleased that the cream could not be sent in. Patient was informed to increase her antihistamine. Patient denied making an appointment due to the cream not being sent in and she verbalized that this was the only cream that helps. Patient stated that she will not come to the office since provider isn't comfortable with the cream she believes that provider can not be of assistance and treat her rash and itching. Patient refuses to visit her dermatologist due to her not being pleased with him. Patient was advised to speak with her PCP. Patient verbalized understanding.

## 2020-06-24 NOTE — Telephone Encounter (Signed)
I have never prescribed that medication and do not feel comfortable with it. Does she seem Dermatologist?   She can certainly double her antihistamine.   She definitely needs an appointment.   Malachi Bonds, MD [facility]

## 2020-06-24 NOTE — Telephone Encounter (Signed)
Patient stated she has been itchy which is causing her to scratch and it is leaving marks on her body. Patient is wondering if we could send in Hydroquinone cream and if it is okay to double her antihistamine. Patient is due for an office visit. Please advise.

## 2020-06-25 ENCOUNTER — Ambulatory Visit: Payer: 59 | Admitting: Physical Therapy

## 2020-06-26 NOTE — Telephone Encounter (Signed)
If she wants to make an appointment, I can certainly talk to her in person.  I do not have any mention of a rash in the last notes, aside from urticaria.  Malachi Bonds, MD [facility]

## 2020-06-27 NOTE — Progress Notes (Deleted)
  [facility] [address] [place] [address] Phone: [phone] Subjective:    I'm seeing this patient by the request  of:  Pincus Sanes, MD  CC:   [id number]  Kathleen Ortiz is a 50 y.o. female coming in with complaint of back and neck pain. OMT 04/30/2020. Patient states   Medications patient has been prescribed: Zanaflex  Taking:    lumbar xray 04/30/2020 IMPRESSION: Mild degenerative changes at L5-S1. No substantial change since prior study.     Reviewed prior external information including notes and imaging from previsou exam, outside providers and external EMR if available.   As well as notes that were available from care everywhere and other healthcare systems.  Past medical history, social, surgical and family history all reviewed in electronic medical record.  No pertanent information unless stated regarding to the chief complaint.   Past Medical History:  Diagnosis Date  . Chronic bronchitis (HCC)   . Eczema   . GERD (gastroesophageal reflux disease)   . Mild persistent asthma 08/02/2018  . Urticaria     Allergies  Allergen Reactions  . Doxycycline Other (See Comments)    headache  . Shellfish Allergy   . Latex Rash     Review of Systems:  No headache, visual changes, nausea, vomiting, diarrhea, constipation, dizziness, abdominal pain, skin rash, fevers, chills, night sweats, weight loss, swollen lymph nodes, body aches, joint swelling, chest pain, shortness of breath, mood changes. POSITIVE muscle aches  Objective  There were no vitals taken for this visit.   General: No apparent distress alert and oriented x3 mood and affect normal, dressed appropriately.  HEENT: Pupils equal, extraocular movements intact  Respiratory: Patient's speak in full sentences and does not appear short of breath  Cardiovascular: No lower extremity edema, non tender, no erythema  Neuro: Cranial nerves II through XII are intact,  neurovascularly intact in all extremities with 2+ DTRs and 2+ pulses.  Gait normal with good balance and coordination.  MSK:  Non tender with full range of motion and good stability and symmetric strength and tone of shoulders, elbows, wrist, hip, knee and ankles bilaterally.  Back - Normal skin, Spine with normal alignment and no deformity.  No tenderness to vertebral process palpation.  Paraspinous muscles are not tender and without spasm.   Range of motion is full at neck and lumbar sacral regions  Osteopathic findings  C2 flexed rotated and side bent right C6 flexed rotated and side bent left T3 extended rotated and side bent right inhaled rib T9 extended rotated and side bent left L2 flexed rotated and side bent right Sacrum right on right       Assessment and Plan:    Nonallopathic problems  Decision today to treat with OMT was based on Physical Exam  After verbal consent patient was treated with HVLA, ME, FPR techniques in cervical, rib, thoracic, lumbar, and sacral  areas  Patient tolerated the procedure well with improvement in symptoms  Patient given exercises, stretches and lifestyle modifications  See medications in patient instructions if given  Patient will follow up in 4-8 weeks      The above documentation has been reviewed and is accurate and complete [name]       Note: This dictation was prepared with Dragon dictation along with smaller phrase technology. Any transcriptional errors that result from this process are unintentional.

## 2020-06-28 ENCOUNTER — Telehealth: Payer: Self-pay | Admitting: Family Medicine

## 2020-06-28 NOTE — Telephone Encounter (Signed)
Patient called asking for a refill on tiZANidine (ZANAFLEX) 2 MG tablet.  Please advise.

## 2020-07-02 ENCOUNTER — Ambulatory Visit: Payer: 59 | Admitting: Family Medicine

## 2020-07-02 ENCOUNTER — Other Ambulatory Visit: Payer: Self-pay

## 2020-07-02 MED ORDER — TIZANIDINE HCL 2 MG PO TABS: 2.0000 mg | ORAL_TABLET | Freq: Every day | ORAL | 0 refills | Status: DC

## 2020-07-02 NOTE — Telephone Encounter (Signed)
Rx sent in.  Patient notified  

## 2020-07-09 ENCOUNTER — Other Ambulatory Visit: Payer: Self-pay | Admitting: Allergy & Immunology

## 2020-07-18 NOTE — Therapy (Signed)
[place], [place], [address] Phone: [phone]   Fax:  [phone]  Physical Therapy Treatment/Discharge  Patient Details  Name: Kathleen Ortiz MRN: [id number] Date of Birth: Apr 05, 1971 Referring Provider (PT): Hulan Saas, DO   Encounter Date: 06/06/2020    Past Medical History:  Diagnosis Date  . Chronic bronchitis ([place])   . Eczema   . GERD (gastroesophageal reflux disease)   . Mild persistent asthma 08/02/2018  . Urticaria     Past Surgical History:  Procedure Laterality Date  . TYMPANOSTOMY TUBE PLACEMENT      There were no vitals filed for this visit.                                PT Short Term Goals - 05/17/20 0810      PT SHORT TERM GOAL #1   Title She will be indpendnet with initial HEP    Period Weeks    Status Achieved      PT SHORT TERM GOAL #2   Title Pain decreased 20% or more    Period Weeks    Status Achieved      PT SHORT TERM GOAL #3   Title FOTO score improved to 52%    Period Weeks    Status Unable to assess      PT SHORT TERM GOAL #4   Title RT patella pain decr 25% or more    Status Achieved             PT Long Term Goals - 06/06/20 1216      PT LONG TERM GOAL #1   Title She will be independent with all HEP issued    Baseline updates ongoing    Time 6    Period Weeks    Status On-going      PT LONG TERM GOAL #2   Title Pain decreased 50% or more  in LT lower back    Time 6    Period Weeks    Status On-going      PT LONG TERM GOAL #3   Title FOTO score improved to  60%    Baseline 52% 06/06/20    Time 6    Period Weeks    Status On-going      PT LONG TERM GOAL #4   Title She will report able to get comfortable to sleep and decr postion changes to 2-3per night    Time 6    Period Weeks    Status On-going      PT LONG TERM GOAL #5   Title She will report no knee pain with home activity    Time 6    Period  Weeks    Status On-going                  Patient will benefit from skilled therapeutic intervention in order to improve the following deficits and impairments:  Pain,Postural dysfunction,Increased muscle spasms,Decreased range of motion,Decreased activity tolerance,Decreased strength  Visit Diagnosis: Chronic bilateral low back pain, unspecified whether sciatica present  Acute pain of left knee  Abnormal posture  Muscle spasm of back  Muscle weakness (generalized)     Problem List Patient Active Problem List   Diagnosis Date Noted  . COVID-19 04/11/2020  . Nonallopathic lesion of sacral region 03/19/2020  . Chronic bilateral low back pain with sciatica 03/08/2020  . Hearing loss  11/23/2019  . Baker's cyst of knee, left 05/08/2019  . Left knee pain 05/08/2019  . Infected cyst of skin 11/30/2018  . Neuralgia of flank, left 11/30/2018  . Perimenopausal symptoms 10/26/2018  . Obesity 10/26/2018  . Mild persistent asthma 08/02/2018  . Fatty liver 12/11/2017  . Allergic conjunctivitis 07/26/2017  . Gastroesophageal reflux disease 08/03/2016  . At risk of diabetes mellitus 08/03/2016  . Recurrent urticaria 05/11/2016  . Food allergy 05/11/2016  . Rash and nonspecific skin eruption 03/17/2016  . Right shoulder pain 08/02/2015  . Perennial and seasonal allergic rhinitis 03/20/2015  . Vitamin D deficiency 03/20/2015  . Eczema 03/20/2015         PHYSICAL THERAPY DISCHARGE SUMMARY  Visits from Start of Care: 6  Current functional level related to goals / functional outcomes: Patient did not return for further therapy after last session 06/06/20-remaining visits cancelled.   Remaining deficits: Current status unknown   Education / Equipment: HEP  Plan: Patient agrees to discharge.  Patient goals were not met. Patient is being discharged due to not returning since the last visit.  ?????          Beaulah Dinning, PT, DPT 07/18/20 9:35  AM     [facility] [address] [place], [place], [address] Phone: [phone]   Fax:  [phone]  Name: Kathleen Ortiz MRN: [id number] Date of Birth: 06-24-1970

## 2020-07-25 NOTE — Progress Notes (Signed)
[facility] [address] [place] [address] Phone: [phone] Subjective:   Bruce Donath, am serving as a scribe for Dr. Antoine Primas. This visit occurred during the SARS-CoV-2 public health emergency.  Safety protocols were in place, including screening questions prior to the visit, additional usage of staff PPE, and extensive cleaning of exam room while observing appropriate contact time as indicated for disinfecting solutions.   I'm seeing this patient by the request  of:  Pincus Sanes, MD  CC: Low back pain follow-up  [id number]   04/30/2020 Continues to have intermittent cramping and some blood.  Patient was found to have low vitamin D and some mild low iron.  Given short course again of Zanaflex which is refilled.  Responded decently to osteopathic manipulation and continue to work with physical therapy.  X-rays of the lumbar ordered today to further evaluate.  If continuing to have difficulty I would like to consider the possibility of MRI of the back.  Follow-up again in 2 months  Update 07/26/2020 Kathleen Ortiz is a 50 y.o. female coming in with complaint of low back pain. States that she has pain daily. Hips are now starting to both her. Using zanaflex 2x a week. Wants to discontinue gabapentin due to side effects.     Past Medical History:  Diagnosis Date  . Chronic bronchitis (HCC)   . Eczema   . GERD (gastroesophageal reflux disease)   . Mild persistent asthma 08/02/2018  . Urticaria    Past Surgical History:  Procedure Laterality Date  . TYMPANOSTOMY TUBE PLACEMENT     Social History   Socioeconomic History  . Marital status: Single    Spouse name: Not on file  . Number of children: Not on file  . Years of education: Not on file  . Highest education level: Not on file  Occupational History  . Not on file  Tobacco Use  . Smoking status: Former Smoker    Packs/day: 0.10    Types: Cigarettes  . Smokeless  tobacco: Never Used  Vaping Use  . Vaping Use: Never used  Substance and Sexual Activity  . Alcohol use: Yes  . Drug use: No  . Sexual activity: Not on file  Other Topics Concern  . Not on file  Social History Narrative   No regular exercise      Social Determinants of Health   Financial Resource Strain: Not on file  Food Insecurity: Not on file  Transportation Needs: Not on file  Physical Activity: Not on file  Stress: Not on file  Social Connections: Not on file   Allergies  Allergen Reactions  . Doxycycline Other (See Comments)    headache  . Shellfish Allergy   . Latex Rash   Family History  Problem Relation Age of Onset  . Arthritis Mother   . Cancer Mother        Lung & colon  . Hypertension Mother   . Diabetes Maternal Aunt   . Arthritis Maternal Grandmother   . Hypertension Maternal Grandmother   . Heart disease Maternal Grandmother   . Diabetes Maternal Grandmother   . Cancer Paternal Grandfather     Current Outpatient Medications (Endocrine & Metabolic):  .  predniSONE (DELTASONE) 20 MG tablet, Take 2 tablets (40 mg total) by mouth daily with breakfast.   Current Outpatient Medications (Respiratory):  .  albuterol (PROAIR HFA) 108 (90 Base) MCG/ACT inhaler, Inhale 2 puffs into the lungs every 4 (  four) hours as needed for wheezing or shortness of breath. .  fluticasone (FLONASE) 50 MCG/ACT nasal spray, Place 2 sprays into both nostrils daily as needed for allergies or rhinitis. Marland Kitchen  levocetirizine (XYZAL) 5 MG tablet, TAKE 1 TABLET BY MOUTH EVERY DAY IN THE EVENING .  promethazine-dextromethorphan (PROMETHAZINE-DM) 6.25-15 MG/5ML syrup, Take 5 mLs by mouth 3 (three) times daily as needed for cough.  Current Outpatient Medications (Analgesics):  .  meloxicam (MOBIC) 15 MG tablet, Take 1 tablet by mouth once daily   Current Outpatient Medications (Other):  .  gabapentin (NEURONTIN) 100 MG capsule, Take 1-3 capsules (100-300 mg total) by mouth at  bedtime. Marland Kitchen  omeprazole (PRILOSEC) 40 MG capsule, Take 40 mg by mouth daily. Marland Kitchen  tiZANidine (ZANAFLEX) 2 MG tablet, Take 1 tablet (2 mg total) by mouth at bedtime. .  valACYclovir (VALTREX) 500 MG tablet, Take by mouth. .  Vitamin D, Ergocalciferol, (DRISDOL) 1.25 MG (50000 UNIT) CAPS capsule, Take 1 capsule (50,000 Units total) by mouth every 7 (seven) days.   Reviewed prior external information including notes and imaging from  primary care provider As well as notes that were available from care everywhere and other healthcare systems.  Past medical history, social, surgical and family history all reviewed in electronic medical record.  No pertanent information unless stated regarding to the chief complaint.   Review of Systems:  No headache, visual changes, nausea, vomiting, diarrhea, constipation, dizziness, abdominal pain, skin rash, fevers, chills, night sweats, weight loss, swollen lymph nodes, body aches, joint swelling, chest pain, shortness of breath, mood changes. POSITIVE muscle aches  Objective  Blood pressure 122/88, pulse (!) 104, height 5\' 2"  (1.575 m), weight 176 lb (79.8 kg), SpO2 98 %.   General: No apparent distress alert and oriented x3 mood and affect normal, dressed appropriately.  HEENT: Pupils equal, extraocular movements intact  Respiratory: Patient's speak in full sentences and does not appear short of breath  Cardiovascular: No lower extremity edema, non tender, no erythema  Gait normal with good balance and coordination.  MSK: Low back exam does have some loss of lordosis.  Some tightness with FABER test bilaterally.  Severe tenderness to palpation more over the greater trochanteric area bilaterally.  After verbal consent patient was prepped with alcohol swab and with a 21-gauge 2 inch needle patient was injected with 2 cc of 0.5% Marcaine and 1 cc of Kenalog 40 mg/mL into the right greater trochanteric area.  No blood loss.  Band-Aid placed.  Postinjection  instructions given  After verbal consent patient was prepped with alcohol swab and with a 21-gauge 2 inch needle injected into the left greater trochanteric area with a total of 2 cc of 0.5% Marcaine and 1 cc of Kenalog 40 mg/mL.  Patient tolerated the procedure well.  No blood loss.  Band-Aid placed.  Postinjection instructions given   Impression and Recommendations:     The above documentation has been reviewed and is accurate and complete , DO

## 2020-07-26 ENCOUNTER — Other Ambulatory Visit: Payer: Self-pay

## 2020-07-26 ENCOUNTER — Encounter: Payer: Self-pay | Admitting: Family Medicine

## 2020-07-26 ENCOUNTER — Ambulatory Visit (INDEPENDENT_AMBULATORY_CARE_PROVIDER_SITE_OTHER): Payer: 59 | Admitting: Family Medicine

## 2020-07-26 DIAGNOSIS — M5440 Lumbago with sciatica, unspecified side: Secondary | ICD-10-CM | POA: Diagnosis not present

## 2020-07-26 DIAGNOSIS — G8929 Other chronic pain: Secondary | ICD-10-CM | POA: Diagnosis not present

## 2020-07-26 DIAGNOSIS — M7061 Trochanteric bursitis, right hip: Secondary | ICD-10-CM | POA: Diagnosis not present

## 2020-07-26 DIAGNOSIS — M7062 Trochanteric bursitis, left hip: Secondary | ICD-10-CM | POA: Diagnosis not present

## 2020-07-26 MED ORDER — VITAMIN D (ERGOCALCIFEROL) 1.25 MG (50000 UNIT) PO CAPS: 50000.0000 [IU] | ORAL_CAPSULE | ORAL | 0 refills | Status: DC

## 2020-07-26 NOTE — Assessment & Plan Note (Signed)
Bilateral injections given today, tolerated procedure well, discussed icing regimen and home exercises.  Discussed differential includes still the lumbar radiculopathy.  Could possibly consider the possibility of an epidural even though MRI did not show any specific nerve root impingement but patient does have symptoms consistent corresponding to an L4-L5 nerve root impingement.  Follow-up with me again 6 weeks

## 2020-07-26 NOTE — Patient Instructions (Signed)
Once weekly Vit D Iron 65mg  Vit C 500mg  3x a week Injected hips today See me again in 2 months and will discuss labs

## 2020-07-26 NOTE — Assessment & Plan Note (Signed)
Patient continues to have chronic low back pain.  Patient has had an MRI in November 2021 that showed some mild degenerative changes but no true nerve impingement.  This was from an outside read.  Patient given greater trochanteric injections today and we will see how patient responds.  Has muscle relaxers for breakthrough.  We discussed the gabapentin.  Discussed core strengthening.  Worsening pain can consider the possibility of an epidural at the L4-L5 level where patient does have the worst degenerative changes follow-up with me again 6 weeks.

## 2020-09-09 ENCOUNTER — Telehealth: Payer: Self-pay | Admitting: Internal Medicine

## 2020-09-09 NOTE — Telephone Encounter (Signed)
[facility] FYI 6.3.22  ---Caller states she fell on [place] and it was swollen and painful. Caller state using ice and taking gabapentin. Caller states has trouble moving fingers but can not twist wrist at all.  Advised to go to ED now

## 2020-09-10 ENCOUNTER — Encounter: Payer: Self-pay | Admitting: Family Medicine

## 2020-09-10 ENCOUNTER — Ambulatory Visit: Payer: Self-pay

## 2020-09-10 ENCOUNTER — Other Ambulatory Visit: Payer: Self-pay

## 2020-09-10 ENCOUNTER — Ambulatory Visit (INDEPENDENT_AMBULATORY_CARE_PROVIDER_SITE_OTHER): Payer: 59 | Admitting: Family Medicine

## 2020-09-10 ENCOUNTER — Ambulatory Visit (INDEPENDENT_AMBULATORY_CARE_PROVIDER_SITE_OTHER): Payer: 59

## 2020-09-10 VITALS — BP 120/78 | HR 87 | Ht 62.0 in | Wt 172.0 lb

## 2020-09-10 DIAGNOSIS — M25531 Pain in right wrist: Secondary | ICD-10-CM

## 2020-09-10 DIAGNOSIS — M79641 Pain in right hand: Secondary | ICD-10-CM | POA: Diagnosis not present

## 2020-09-10 NOTE — Assessment & Plan Note (Addendum)
Patient did have a fall.  Patient did have x-rays done today did not show any acute fracture that I can see but overread is pending.  Differential also includes a potential ulnar neuropathy.  Discussed continuing the gabapentin as well as the meloxicam.  We discussed also the possibility of the Zanaflex at night.  Discussed with patient about seeing me again in 2 weeks.  Patient would like to wait until she gets a different insurance will possibly.  Patient would like to wait for 3 weeks.  We discussed the potential also add an x-ray of her right elbow secondary to discomfort but patient wants to hold again secondary to the insurance but will consider it at follow-up.  Once again follow-up in 2 to 3 weeks.  Patient will be in a removable splint and topical anti-inflammatories in addition to the above. Patient does have some signs and symptoms consistent with potential ulnar neuropathy

## 2020-09-10 NOTE — Progress Notes (Signed)
[facility] [address] [place] [address] Phone: [phone] Subjective:   Bruce Donath, am serving as a scribe for Dr. Antoine Primas. This visit occurred during the SARS-CoV-2 public health emergency.  Safety protocols were in place, including screening questions prior to the visit, additional usage of staff PPE, and extensive cleaning of exam room while observing appropriate contact time as indicated for disinfecting solutions.   I'm seeing this patient by the request  of:  Pincus Sanes, MD  CC: Right wrist pain  [id number]   07/26/2020 Bilateral injections given today, tolerated procedure well, discussed icing regimen and home exercises.  Discussed differential includes still the lumbar radiculopathy.  Could possibly consider the possibility of an epidural even though MRI did not show any specific nerve root impingement but patient does have symptoms consistent corresponding to an L4-L5 nerve root impingement.  Follow-up with me again 6 weeks  Patient continues to have chronic low back pain.  Patient has had an MRI in November 2021 that showed some mild degenerative changes but no true nerve impingement.  This was from an outside read.  Patient given greater trochanteric injections today and we will see how patient responds.  Has muscle relaxers for breakthrough.  We discussed the gabapentin.  Discussed core strengthening.  Worsening pain can consider the possibility of an epidural at the L4-L5 level where patient does have the worst degenerative changes follow-up with me again 6 weeks.  Update 09/10/2020 NICLE CONNOLE is a 50 y.o. female coming in with complaint of R hand pain. Patient suffered a fall. Patient states that she fell after standing on a chair on [place]. Pain over dorsum of wrist and into the palm. Hard to open hand. Swelling has not subsided. Patient feels like she blacked out and is not sure how she landed on ground. Feels  like hip gave out on her. Tingling in finger tips.  Patient states that it seems to be more on the ulnar aspect that she had to say but symptoms seem to across all the fingers.  Some mild elbow pain with it.  Initially did have pain in the shoulder and the neck as well but seems to be getting better in those areas.  Hip pain subsided since injections.  Feels like this is doing relatively well.      Past Medical History:  Diagnosis Date  . Chronic bronchitis (HCC)   . Eczema   . GERD (gastroesophageal reflux disease)   . Mild persistent asthma 08/02/2018  . Urticaria    Past Surgical History:  Procedure Laterality Date  . TYMPANOSTOMY TUBE PLACEMENT     Social History   Socioeconomic History  . Marital status: Single    Spouse name: Not on file  . Number of children: Not on file  . Years of education: Not on file  . Highest education level: Not on file  Occupational History  . Not on file  Tobacco Use  . Smoking status: Former Smoker    Packs/day: 0.10    Types: Cigarettes  . Smokeless tobacco: Never Used  Vaping Use  . Vaping Use: Never used  Substance and Sexual Activity  . Alcohol use: Yes  . Drug use: No  . Sexual activity: Not on file  Other Topics Concern  . Not on file  Social History Narrative   No regular exercise      Social Determinants of Health   Financial Resource Strain: Not on file  Food Insecurity: Not on file  Transportation Needs: Not on file  Physical Activity: Not on file  Stress: Not on file  Social Connections: Not on file   Allergies  Allergen Reactions  . Doxycycline Other (See Comments)    headache  . Shellfish Allergy   . Latex Rash   Family History  Problem Relation Age of Onset  . Arthritis Mother   . Cancer Mother        Lung & colon  . Hypertension Mother   . Diabetes Maternal Aunt   . Arthritis Maternal Grandmother   . Hypertension Maternal Grandmother   . Heart disease Maternal Grandmother   . Diabetes Maternal  Grandmother   . Cancer Paternal Grandfather     Current Outpatient Medications (Endocrine & Metabolic):  .  predniSONE (DELTASONE) 20 MG tablet, Take 2 tablets (40 mg total) by mouth daily with breakfast.   Current Outpatient Medications (Respiratory):  .  albuterol (PROAIR HFA) 108 (90 Base) MCG/ACT inhaler, Inhale 2 puffs into the lungs every 4 (four) hours as needed for wheezing or shortness of breath. .  fluticasone (FLONASE) 50 MCG/ACT nasal spray, Place 2 sprays into both nostrils daily as needed for allergies or rhinitis. Marland Kitchen  levocetirizine (XYZAL) 5 MG tablet, TAKE 1 TABLET BY MOUTH EVERY DAY IN THE EVENING .  promethazine-dextromethorphan (PROMETHAZINE-DM) 6.25-15 MG/5ML syrup, Take 5 mLs by mouth 3 (three) times daily as needed for cough.  Current Outpatient Medications (Analgesics):  .  meloxicam (MOBIC) 15 MG tablet, Take 1 tablet by mouth once daily   Current Outpatient Medications (Other):  .  gabapentin (NEURONTIN) 100 MG capsule, Take 1-3 capsules (100-300 mg total) by mouth at bedtime. Marland Kitchen  omeprazole (PRILOSEC) 40 MG capsule, Take 40 mg by mouth daily. Marland Kitchen  tiZANidine (ZANAFLEX) 2 MG tablet, Take 1 tablet (2 mg total) by mouth at bedtime. .  valACYclovir (VALTREX) 500 MG tablet, Take by mouth. .  Vitamin D, Ergocalciferol, (DRISDOL) 1.25 MG (50000 UNIT) CAPS capsule, Take 1 capsule (50,000 Units total) by mouth every 7 (seven) days.   Reviewed prior external information including notes and imaging from  primary care provider As well as notes that were available from care everywhere and other healthcare systems.  Past medical history, social, surgical and family history all reviewed in electronic medical record.  No pertanent information unless stated regarding to the chief complaint.   Review of Systems:  No headache, visual changes, nausea, vomiting, diarrhea, constipation, dizziness, abdominal pain, skin rash, fevers, chills, night sweats, weight loss, swollen lymph  nodes, body aches, joint swelling, chest pain, shortness of breath, mood changes. POSITIVE muscle aches  Objective  Blood pressure 120/78, pulse 87, height 5\' 2"  (1.575 m), weight 172 lb (78 kg), last menstrual period 09/06/2020, SpO2 98 %.   General: No apparent distress alert and oriented x3 mood and affect normal, dressed appropriately.  HEENT: Pupils equal, extraocular movements intact  Respiratory: Patient's speak in full sentences and does not appear short of breath  Cardiovascular: No lower extremity edema, non tender, no erythema  Gait normal with good balance and coordination.  MSK: Right wrist exam shows the patient does have significant voluntary guarding.  Patient has pain with any type of supination noted.  Patient diffusely tender including over the scaphoid bone as well as over the ulnar side of the wrist.  Patient does have decent grip strength.  Good capillary refill noted.  Patient's elbow is severely tender to palpation with full flexion and extension noted.  Mild pain on the ulnar aspect and mild positive Tinel's.  Limited musculoskeletal ultrasound was performed and interpreted by Judi Saa  Limited ultrasound of patient's wrist shows the patient does have some very mild hypoechoic changes of the dorsal aspect of the wrist.  Patient was median nerve no significant inflammation noted.  No significant cortical irregularity noted of the scaphoid bone. Impression: Fairly nonspecific findings of the wrist    Impression and Recommendations:     The above documentation has been reviewed and is accurate and complete Judi Saa, DO

## 2020-09-10 NOTE — Patient Instructions (Signed)
See me again on June 20th Ice 20 min 2x a day Pennsaid 2x a day Stay in brace day and night until I see you

## 2020-09-26 NOTE — Progress Notes (Signed)
[facility] [address] [place] [address] Phone: [phone] Subjective:   Bruce Donath, am serving as a scribe for Dr. Antoine Primas.  This visit occurred during the SARS-CoV-2 public health emergency.  Safety protocols were in place, including screening questions prior to the visit, additional usage of staff PPE, and extensive cleaning of exam room while observing appropriate contact time as indicated for disinfecting solutions.    I'm seeing this patient by the request  of:  Pincus Sanes, MD  CC: Right wrist pain follow-up  [id number]  09/10/2020 Patient did have a fall.  Patient did have x-rays done today did not show any acute fracture that I can see but overread is pending.  Differential also includes a potential ulnar neuropathy.  Discussed continuing the gabapentin as well as the meloxicam.  We discussed also the possibility of the Zanaflex at night.  Discussed with patient about seeing me again in 2 weeks.  Patient would like to wait until she gets a different insurance will possibly.  Patient would like to wait for 3 weeks.  We discussed the potential also add an x-ray of her right elbow secondary to discomfort but patient wants to hold again secondary to the insurance but will consider it at follow-up.  Once again follow-up in 2 to 3 weeks.  Patient will be in a removable splint and topical anti-inflammatories in addition to the above.  Patient does have some signs and symptoms consistent with potential ulnar neuropathy  Update 09/27/2020 Kathleen Ortiz is a 50 y.o. female coming in with complaint of low back and right wrist pain. Patient states that her wrist is still swollen and painful. Patient has been wearing brace.  Patient states that she has not noticed any significant improvement.  Has not been doing any exercises.  Has continued to take the gabapentin as well as the meloxicam and sometimes the Zanaflex.  Patient has been able  to work in the brace but is finding it very difficult.  Patient states that she is unable to do anything else and has some difficulty with activities of daily living including dressing.  Hip and back pain are better.        Past Medical History:  Diagnosis Date   Chronic bronchitis (HCC)    Eczema    GERD (gastroesophageal reflux disease)    Mild persistent asthma 08/02/2018   Urticaria    Past Surgical History:  Procedure Laterality Date   TYMPANOSTOMY TUBE PLACEMENT     Social History   Socioeconomic History   Marital status: Single    Spouse name: Not on file   Number of children: Not on file   Years of education: Not on file   Highest education level: Not on file  Occupational History   Not on file  Tobacco Use   Smoking status: Former    Packs/day: 0.10    Pack years: 0.00    Types: Cigarettes   Smokeless tobacco: Never  Vaping Use   Vaping Use: Never used  Substance and Sexual Activity   Alcohol use: Yes   Drug use: No   Sexual activity: Not on file  Other Topics Concern   Not on file  Social History Narrative   No regular exercise      Social Determinants of Health   Financial Resource Strain: Not on file  Food Insecurity: Not on file  Transportation Needs: Not on file  Physical Activity: Not on file  Stress: Not on file  Social Connections: Not on file   Allergies  Allergen Reactions   Doxycycline Other (See Comments)    headache   Shellfish Allergy    Latex Rash   Family History  Problem Relation Age of Onset   Arthritis Mother    Cancer Mother        Lung & colon   Hypertension Mother    Diabetes Maternal Aunt    Arthritis Maternal Grandmother    Hypertension Maternal Grandmother    Heart disease Maternal Grandmother    Diabetes Maternal Grandmother    Cancer Paternal Grandfather     Current Outpatient Medications (Endocrine & Metabolic):    predniSONE (DELTASONE) 20 MG tablet, Take 2 tablets (40 mg total) by mouth daily with  breakfast.   Current Outpatient Medications (Respiratory):    albuterol (PROAIR HFA) 108 (90 Base) MCG/ACT inhaler, Inhale 2 puffs into the lungs every 4 (four) hours as needed for wheezing or shortness of breath.   fluticasone (FLONASE) 50 MCG/ACT nasal spray, Place 2 sprays into both nostrils daily as needed for allergies or rhinitis.   levocetirizine (XYZAL) 5 MG tablet, TAKE 1 TABLET BY MOUTH EVERY DAY IN THE EVENING   promethazine-dextromethorphan (PROMETHAZINE-DM) 6.25-15 MG/5ML syrup, Take 5 mLs by mouth 3 (three) times daily as needed for cough.  Current Outpatient Medications (Analgesics):    meloxicam (MOBIC) 15 MG tablet, Take 1 tablet by mouth once daily   Current Outpatient Medications (Other):    gabapentin (NEURONTIN) 100 MG capsule, Take 1-3 capsules (100-300 mg total) by mouth at bedtime.   omeprazole (PRILOSEC) 40 MG capsule, Take 40 mg by mouth daily.   tiZANidine (ZANAFLEX) 2 MG tablet, Take 1 tablet (2 mg total) by mouth at bedtime.   valACYclovir (VALTREX) 500 MG tablet, Take by mouth.   Vitamin D, Ergocalciferol, (DRISDOL) 1.25 MG (50000 UNIT) CAPS capsule, Take 1 capsule (50,000 Units total) by mouth every 7 (seven) days.   Reviewed prior external information including notes and imaging from  primary care provider As well as notes that were available from care everywhere and other healthcare systems.  Past medical history, social, surgical and family history all reviewed in electronic medical record.  No pertanent information unless stated regarding to the chief complaint.   Review of Systems:  No headache, visual changes, nausea, vomiting, diarrhea, constipation, dizziness, abdominal pain, skin rash, fevers, chills, night sweats, weight loss, swollen lymph nodes, body aches, joint swelling, chest pain, shortness of breath, mood changes. POSITIVE muscle aches  Objective  Blood pressure 118/88, pulse 82, height 5\' 2"  (1.575 m), weight 174 lb (78.9 kg), last  menstrual period 09/06/2020, SpO2 99 %.   General: No apparent distress alert and oriented x3 mood and affect normal, dressed appropriately.  HEENT: Pupils equal, extraocular movements intact  Respiratory: Patient's speak in full sentences and does not appear short of breath  Cardiovascular: No lower extremity edema, non tender, no erythema  Gait normal with good balance and coordination.  MSK: Patient is voluntary guarding the wrist significantly.  Patient has potential weakness secondary to pain.  He does have weakness with resisted extension.  Patient has diffuse tenderness overall. Difficult to do exam secondary to patient guarding.  Patient does have full flexion and extension of the elbow but still has difficulty with supination for the last 5 degrees.  Limited musculoskeletal ultrasound of the wrist shows the patient does have some hypoechoic changes noted of the TFCC patient also has an abnormality noted  in the third extensor compartment at the musculotendinous juncture there could be a potential tear noted.  No significant abnormality noted over the scaphoid bone. Impression: Questionable TFCC tear.   Impression and Recommendations:     The above documentation has been reviewed and is accurate and complete Judi Saa, DO

## 2020-09-27 ENCOUNTER — Ambulatory Visit (INDEPENDENT_AMBULATORY_CARE_PROVIDER_SITE_OTHER): Payer: 59

## 2020-09-27 ENCOUNTER — Ambulatory Visit (INDEPENDENT_AMBULATORY_CARE_PROVIDER_SITE_OTHER): Payer: 59 | Admitting: Family Medicine

## 2020-09-27 ENCOUNTER — Other Ambulatory Visit: Payer: Self-pay

## 2020-09-27 ENCOUNTER — Encounter: Payer: Self-pay | Admitting: Family Medicine

## 2020-09-27 ENCOUNTER — Ambulatory Visit: Payer: Self-pay

## 2020-09-27 VITALS — BP 118/88 | HR 82 | Ht 62.0 in | Wt 174.0 lb

## 2020-09-27 DIAGNOSIS — M25521 Pain in right elbow: Secondary | ICD-10-CM

## 2020-09-27 DIAGNOSIS — M25531 Pain in right wrist: Secondary | ICD-10-CM | POA: Diagnosis not present

## 2020-09-27 NOTE — Patient Instructions (Signed)
R elbow xray [facility] wrist [phone] We will be in touch
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
## 2020-10-02 ENCOUNTER — Telehealth: Payer: Self-pay | Admitting: Family Medicine

## 2020-10-02 NOTE — Telephone Encounter (Signed)
Patient called stating that she has been trying to exercises but is still having a lot of pain in her hands. She asked if there was something Dr Katrinka Blazing could suggest or prescribe. The Gabapentin is not helping at this point.

## 2020-10-08 NOTE — Telephone Encounter (Signed)
Left patient message to call back to discuss options if she is still experiencing pain.

## 2020-10-17 ENCOUNTER — Other Ambulatory Visit: Payer: 59

## 2020-10-24 ENCOUNTER — Other Ambulatory Visit: Payer: Self-pay

## 2020-10-24 ENCOUNTER — Emergency Department (HOSPITAL_COMMUNITY)
Admission: EM | Admit: 2020-10-24 | Discharge: 2020-10-24 | Disposition: A | Discharge status: Home / Self Care | Payer: 59 | Attending: Emergency Medicine | Admitting: Emergency Medicine

## 2020-10-24 ENCOUNTER — Emergency Department (HOSPITAL_COMMUNITY): Payer: 59

## 2020-10-24 DIAGNOSIS — J4530 Mild persistent asthma, uncomplicated: Secondary | ICD-10-CM | POA: Insufficient documentation

## 2020-10-24 DIAGNOSIS — Z8616 Personal history of COVID-19: Secondary | ICD-10-CM | POA: Diagnosis not present

## 2020-10-24 DIAGNOSIS — M25562 Pain in left knee: Secondary | ICD-10-CM | POA: Insufficient documentation

## 2020-10-24 DIAGNOSIS — M25532 Pain in left wrist: Secondary | ICD-10-CM | POA: Diagnosis not present

## 2020-10-24 DIAGNOSIS — R0789 Other chest pain: Secondary | ICD-10-CM | POA: Diagnosis not present

## 2020-10-24 DIAGNOSIS — Y92410 Unspecified street and highway as the place of occurrence of the external cause: Secondary | ICD-10-CM | POA: Diagnosis not present

## 2020-10-24 DIAGNOSIS — Z91040 Latex allergy status: Secondary | ICD-10-CM | POA: Insufficient documentation

## 2020-10-24 DIAGNOSIS — S4992XA Unspecified injury of left shoulder and upper arm, initial encounter: Secondary | ICD-10-CM | POA: Diagnosis present

## 2020-10-24 DIAGNOSIS — S46912A Strain of unspecified muscle, fascia and tendon at shoulder and upper arm level, left arm, initial encounter: Secondary | ICD-10-CM | POA: Insufficient documentation

## 2020-10-24 DIAGNOSIS — Z87891 Personal history of nicotine dependence: Secondary | ICD-10-CM | POA: Diagnosis not present

## 2020-10-24 MED ORDER — ACETAMINOPHEN 500 MG PO TABS
1000.0000 mg | ORAL_TABLET | Freq: Once | ORAL | Status: AC
  Administered 2020-10-24: 1000 mg via ORAL
  Filled 2020-10-24: qty 2

## 2020-10-24 MED ORDER — ACETAMINOPHEN 325 MG PO TABS: 650.0000 mg | ORAL_TABLET | Freq: Once | ORAL | Status: DC | PRN

## 2020-10-24 MED ORDER — OXYCODONE-ACETAMINOPHEN 5-325 MG PO TABS
2.0000 | ORAL_TABLET | Freq: Once | ORAL | Status: AC
  Administered 2020-10-24: 2 via ORAL
  Filled 2020-10-24: qty 2

## 2020-10-24 MED ORDER — CYCLOBENZAPRINE HCL 10 MG PO TABS: 10.0000 mg | ORAL_TABLET | Freq: Two times a day (BID) | ORAL | 0 refills | Status: DC | PRN
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
## 2020-10-24 NOTE — Discharge Instructions (Addendum)
No evidence of broken bones or seen on your x-rays. Please use ibuprofen 400 to 600 mg over-the-counter as per package instructions as well as the prescription Flexeril. You are given a dose of pain medicine here in the ED please do not drive or operate heavy machinery over the next 12 to 24 hours Sure if you are having any worsening symptoms especially sudden weakness, headache, neck pain, or abdominal pain.
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
## 2020-10-24 NOTE — ED Notes (Signed)
Patient Alert and oriented to baseline. Stable and ambulatory to baseline. Patient verbalized understanding of the discharge instructions.  Patient belongings were taken by the patient.   
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
## 2021-04-01 LAB — HM COLONOSCOPY

## 2021-04-13 NOTE — Progress Notes (Signed)
Subjective:    Patient ID: Kathleen Ortiz, female    DOB: 09/02/1970, 51 y.o.   MRN: [id number]  This visit occurred during the SARS-CoV-2 public health emergency.  Safety protocols were in place, including screening questions prior to the visit, additional usage of staff PPE, and extensive cleaning of exam room while observing appropriate contact time as indicated for disinfecting solutions.    HPI The patient is here for an acute visit.    muscle spasms/cramping/tingling in both feet, severe x 6 months - it occurs in both feet - the pain is the whole bottom of the foot, toes. The top/dorsal aspect does not hurt. The ankles and lower legs are cramping and uncomfortable.    It used to be only at night and this year it occurs all day long.  It does not matter what shoes she wears - she can just not wear anything time.  She can not wear flat shoes/heals.  She typically she is wearing sneakers.  It occurs daily.  She touches her feet now and they are very tender/bruised like.    When it first started she was walking a lot (6 miles a day) and standing in one spot made it works, but now it does not matter - it is bad all the time.  When it first started it was worse first getting out of bed and would get better the more she moved.     Right now - not during any exercise.  She has tried multiple medications and nothing really helped much.  She does not go barefoot.    Medications and allergies reviewed with patient and updated if appropriate.  Patient Active Problem List   Diagnosis Date Noted   Chronic pain of both feet 04/14/2021   Acute pain of right wrist 09/10/2020   Greater trochanteric bursitis of both hips 07/26/2020   COVID-19 04/11/2020   Nonallopathic lesion of sacral region 03/19/2020   Chronic bilateral low back pain with sciatica 03/08/2020   Hearing loss 11/23/2019   Baker's cyst of knee, left 05/08/2019   Left knee pain 05/08/2019   Infected cyst of skin  11/30/2018   Neuralgia of flank, left 11/30/2018   Perimenopausal symptoms 10/26/2018   Obesity 10/26/2018   Mild persistent asthma 08/02/2018   Fatty liver 12/11/2017   Allergic conjunctivitis 07/26/2017   Gastroesophageal reflux disease 08/03/2016   At risk of diabetes mellitus 08/03/2016   Recurrent urticaria 05/11/2016   Food allergy 05/11/2016   Rash and nonspecific skin eruption 03/17/2016   Right shoulder pain 08/02/2015   Perennial and seasonal allergic rhinitis 03/20/2015   Vitamin D deficiency 03/20/2015   Eczema 03/20/2015    Current Outpatient Medications on File Prior to Visit  Medication Sig Dispense Refill   albuterol (PROAIR HFA) 108 (90 Base) MCG/ACT inhaler Inhale 2 puffs into the lungs every 4 (four) hours as needed for wheezing or shortness of breath. 18 g 0   cyclobenzaprine (FLEXERIL) 10 MG tablet Take 1 tablet (10 mg total) by mouth 2 (two) times daily as needed for muscle spasms. 20 tablet 0   fluticasone (FLONASE) 50 MCG/ACT nasal spray Place 2 sprays into both nostrils daily as needed for allergies or rhinitis. 16 g 2   gabapentin (NEURONTIN) 100 MG capsule Take 1-3 capsules (100-300 mg total) by mouth at bedtime. 90 capsule 3   omeprazole (PRILOSEC) 40 MG capsule Take 40 mg by mouth daily.     tiZANidine (ZANAFLEX) 2 MG tablet Take  1 tablet (2 mg total) by mouth at bedtime. 30 tablet 0   valACYclovir (VALTREX) 500 MG tablet Take by mouth.     Vitamin D, Ergocalciferol, (DRISDOL) 1.25 MG (50000 UNIT) CAPS capsule Take 1 capsule (50,000 Units total) by mouth every 7 (seven) days. 12 capsule 0   levocetirizine (XYZAL) 5 MG tablet TAKE 1 TABLET BY MOUTH EVERY DAY IN THE EVENING (Patient not taking: Reported on 04/14/2021) 30 tablet 2   No current facility-administered medications on file prior to visit.    Past Medical History:  Diagnosis Date   Chronic bronchitis (HCC)    Eczema    GERD (gastroesophageal reflux disease)    Mild persistent asthma 08/02/2018    Urticaria     Past Surgical History:  Procedure Laterality Date   TYMPANOSTOMY TUBE PLACEMENT      Social History   Socioeconomic History   Marital status: Single    Spouse name: Not on file   Number of children: Not on file   Years of education: Not on file   Highest education level: Not on file  Occupational History   Not on file  Tobacco Use   Smoking status: Former    Packs/day: 0.10    Types: Cigarettes   Smokeless tobacco: Never  Vaping Use   Vaping Use: Never used  Substance and Sexual Activity   Alcohol use: Yes   Drug use: No   Sexual activity: Not on file  Other Topics Concern   Not on file  Social History Narrative   No regular exercise      Social Determinants of Health   Financial Resource Strain: Not on file  Food Insecurity: Not on file  Transportation Needs: Not on file  Physical Activity: Not on file  Stress: Not on file  Social Connections: Not on file    Family History  Problem Relation Age of Onset   Arthritis Mother    Cancer Mother        Lung & colon   Hypertension Mother    Diabetes Maternal Aunt    Arthritis Maternal Grandmother    Hypertension Maternal Grandmother    Heart disease Maternal Grandmother    Diabetes Maternal Grandmother    Cancer Paternal Grandfather     Review of Systems     Objective:   Vitals:   04/14/21 0850  BP: 130/74  Pulse: 90  Temp: 98.9 F (37.2 C)  SpO2: 99%   BP Readings from Last 3 Encounters:  04/14/21 130/74  10/24/20 (!) 150/106  09/27/20 118/88   Wt Readings from Last 3 Encounters:  04/14/21 174 lb (78.9 kg)  10/24/20 173 lb (78.5 kg)  09/27/20 174 lb (78.9 kg)   Body mass index is 30.82 kg/m.   Physical Exam Constitutional:      Appearance: Normal appearance.  HENT:     Head: Normocephalic and atraumatic.  Musculoskeletal:     Comments: B/l feet tenderness with palpation plantar surface mostly along bottom of feet and side of foot - no swelling.  Mild achilles/lower  calf tenderness  Skin:    General: Skin is warm and dry.     Findings: No bruising, erythema or lesion.  Neurological:     Mental Status: She is alert.     Sensory: No sensory deficit.           Assessment & Plan:    See Problem List for Assessment and Plan of chronic medical problems.

## 2021-04-14 ENCOUNTER — Encounter: Payer: Self-pay | Admitting: Internal Medicine

## 2021-04-14 ENCOUNTER — Ambulatory Visit (INDEPENDENT_AMBULATORY_CARE_PROVIDER_SITE_OTHER): Payer: 59 | Admitting: Internal Medicine

## 2021-04-14 ENCOUNTER — Other Ambulatory Visit: Payer: Self-pay

## 2021-04-14 VITALS — BP 130/74 | HR 90 | Temp 98.9°F | Ht 63.0 in | Wt 174.0 lb

## 2021-04-14 DIAGNOSIS — G8929 Other chronic pain: Secondary | ICD-10-CM | POA: Diagnosis not present

## 2021-04-14 DIAGNOSIS — M79671 Pain in right foot: Secondary | ICD-10-CM | POA: Diagnosis not present

## 2021-04-14 DIAGNOSIS — M79672 Pain in left foot: Secondary | ICD-10-CM | POA: Diagnosis not present

## 2021-04-14 MED ORDER — MELOXICAM 15 MG PO TABS: 15.0000 mg | ORAL_TABLET | Freq: Every day | ORAL | 1 refills | Status: DC

## 2021-04-14 NOTE — Patient Instructions (Addendum)
° ° °  Medications changes include :   meloxicam 15 mg daily  Your prescription(s) have been submitted to your pharmacy. Please take as directed and contact our office if you believe you are having problem(s) with the medication(s).   A referral was ordered for [facility] - Dr Victorino Dike.      Someone from their office will call you to schedule an appointment.

## 2021-04-14 NOTE — Assessment & Plan Note (Signed)
New - has been going on for a while > 6 months B/l feet - plantar surface to posterior distal calf Did see podiatry in the past for this when it first started and was told it may be neuropathy ? Plantar fasciitis vs other inflammatory disease, less likely neuropathy Referred to [facility] - dr hewitt meloxicam 15 mg daily with food

## 2021-04-15 ENCOUNTER — Other Ambulatory Visit: Payer: Self-pay | Admitting: Obstetrics and Gynecology

## 2021-05-05 ENCOUNTER — Encounter: Payer: Self-pay | Admitting: Internal Medicine

## 2021-05-05 ENCOUNTER — Ambulatory Visit: Payer: Managed Care, Other (non HMO) | Admitting: Internal Medicine

## 2021-05-05 ENCOUNTER — Other Ambulatory Visit: Payer: Self-pay

## 2021-05-05 VITALS — BP 138/90 | HR 67 | Ht 63.0 in | Wt 172.2 lb

## 2021-05-05 DIAGNOSIS — R011 Cardiac murmur, unspecified: Secondary | ICD-10-CM

## 2021-05-05 DIAGNOSIS — I447 Left bundle-branch block, unspecified: Secondary | ICD-10-CM | POA: Diagnosis not present

## 2021-05-05 NOTE — Patient Instructions (Signed)
Medication Instructions:  °No Changes In Medications at this time.  °*If you need a refill on your cardiac medications before your next appointment, please call your pharmacy* ° °Testing/Procedures: °Your physician has requested that you have an echocardiogram. Echocardiography is a painless test that uses sound waves to create images of your heart. It provides your doctor with information about the size and shape of your heart and how well your heart’s chambers and valves are working. You may receive an ultrasound enhancing agent through an IV if needed to better visualize your heart during the echo.This procedure takes approximately one hour. There are no restrictions for this procedure. This will take place at the [address], [address].  ° °Follow-Up: °At [facility], you and your health needs are our priority.  As part of our continuing mission to provide you with exceptional heart care, we have created designated Provider Care Teams.  These Care Teams include your primary Cardiologist (physician) and Advanced Practice Providers (APPs -  Physician Assistants and Nurse Practitioners) who all work together to provide you with the care you need, when you need it. ° °Your next appointment:   °3 month(s) ° °The format for your next appointment:   °In Person ° °Provider:   °Branch, Mary E, MD   °

## 2021-05-05 NOTE — Progress Notes (Signed)
Cardiology Office Note:    Date:  05/05/2021   ID:  Kathleen Ortiz, DOB 1970-12-22, MRN [id number]  PCP:  Pincus Sanes, MD   [facility] Providers Cardiologist:  Maisie Fus, MD     Referring MD: Pincus Sanes, MD   No chief complaint on file. Heart Murmur  History of Present Illness:    Kathleen Ortiz is a 51 y.o. female with a hx of GERD, G2P2, referral form Dr. Lawerance Bach for heart murmur (3/6 SEM )  She has no hx of heart disease. She notes sob with weather changes; thinking it may be more seasonal allergy. She has seasonal asthma.  She notes some heart fluttering. It can be once per week. She can get dizzy at the end of the day. No syncope.No congenital heart disease. She denies chest pain or SOB. She denies PND or orthopnea or LE edema.  She had one pregnancy that was healthy. The second was preeclampsia. She had significant hypertension and needed to deliver at 25 weeks. This was 28 years ago. She's had no issues with blood pressure since then. No DM2 or gestational diabetes.  Family hx- MGM had heart disease. No 1st degree relatives with heart disease  Social Hx - she smoked cigarettes for 10-15 years; quit about 5 to 6 years ago. She is active. She works every day.   A1c 5.6% TC 155 LDL 88 TSH normal  She has EKG showing LBBB with no prior EKG to compare  Past Medical History:  Diagnosis Date   Chronic bronchitis (HCC)    Eczema    GERD (gastroesophageal reflux disease)    Mild persistent asthma 08/02/2018   Urticaria     Past Surgical History:  Procedure Laterality Date   TYMPANOSTOMY TUBE PLACEMENT      Current Medications: Current Meds  Medication Sig   albuterol (PROAIR HFA) 108 (90 Base) MCG/ACT inhaler Inhale 2 puffs into the lungs every 4 (four) hours as needed for wheezing or shortness of breath.   fluticasone (FLONASE) 50 MCG/ACT nasal spray Place 2 sprays into both nostrils daily as needed for allergies or rhinitis.   levocetirizine  (XYZAL) 5 MG tablet TAKE 1 TABLET BY MOUTH EVERY DAY IN THE EVENING   meloxicam (MOBIC) 15 MG tablet Take 1 tablet (15 mg total) by mouth daily.   omeprazole (PRILOSEC) 40 MG capsule Take 40 mg by mouth daily.   Probiotic Product (DIGESTIVE ADVANTAGE) CAPS 1 capsule   tiZANidine (ZANAFLEX) 2 MG tablet Take 1 tablet (2 mg total) by mouth at bedtime.   valACYclovir (VALTREX) 500 MG tablet Take by mouth.   Vitamin D, Ergocalciferol, (DRISDOL) 1.25 MG (50000 UNIT) CAPS capsule Take 1 capsule (50,000 Units total) by mouth every 7 (seven) days.     Allergies:   Doxycycline, Shellfish allergy, and Latex   Social History   Socioeconomic History   Marital status: Single    Spouse name: Not on file   Number of children: Not on file   Years of education: Not on file   Highest education level: Not on file  Occupational History   Not on file  Tobacco Use   Smoking status: Former    Packs/day: 0.10    Types: Cigarettes   Smokeless tobacco: Never  Vaping Use   Vaping Use: Never used  Substance and Sexual Activity   Alcohol use: Yes   Drug use: No   Sexual activity: Not on file  Other Topics Concern   Not  on file  Social History Narrative   No regular exercise      Social Determinants of Health   Financial Resource Strain: Not on file  Food Insecurity: Not on file  Transportation Needs: Not on file  Physical Activity: Not on file  Stress: Not on file  Social Connections: Not on file     Family History: The patient's family history includes Arthritis in her maternal grandmother and mother; Cancer in her mother and paternal grandfather; Diabetes in her maternal aunt and maternal grandmother; Heart disease in her maternal grandmother; Hypertension in her maternal grandmother and mother.  ROS:   Please see the history of present illness.     All other systems reviewed and are negative.  EKGs/Labs/Other Studies Reviewed:    The following studies were reviewed today:   EKG:  EKG  is  ordered today.  The ekg ordered today demonstrates   NSR, LBBB  Recent Labs: No results found for requested labs within last 8760 hours.  Recent Lipid Panel    Component Value Date/Time   CHOL 155 10/26/2018 0956   TRIG 78.0 10/26/2018 0956   HDL 51.50 10/26/2018 0956   CHOLHDL 3 10/26/2018 0956   VLDL 15.6 10/26/2018 0956   LDLCALC 88 10/26/2018 0956     Risk Assessment/Calculations:           Physical Exam:    VS:  BP 138/90 (BP Location: Left Arm, Patient Position: Sitting)    Pulse 67    Ht 5\' 3"  (1.6 m)    Wt 172 lb 3.2 oz (78.1 kg)    SpO2 98%    BMI 30.50 kg/m     Wt Readings from Last 3 Encounters:  05/05/21 172 lb 3.2 oz (78.1 kg)  04/14/21 174 lb (78.9 kg)  10/24/20 173 lb (78.5 kg)     GEN:  Well nourished, well developed in no acute distress HEENT: Normal NECK: No JVD; No carotid bruits LYMPHATICS: No lymphadenopathy CARDIAC: RRR, soft 3/6 SEUM, rubs, gallops RESPIRATORY:  Clear to auscultation without rales, wheezing or rhonchi  ABDOMEN: Soft, non-tender, non-distended MUSCULOSKELETAL:  No edema; No deformity  SKIN: Warm and dry NEUROLOGIC:  Alert and oriented x 3 PSYCHIATRIC:  Normal affect   ASSESSMENT:    #Heart Murmur: She has a soft SEM. She has no signs or symptoms of aortic stenosis. Her murmur is benign  #New LBBB: She has no new LBBB with no prior ECG. She has no signs of CAD or CHF. However, will plan for an echocardiogram with risk of LBBB associated with cardiac disease. Further women with pre-eclampsia have four fold increase in incident CHF and two fold increased risk in coronary disease. [Wu et al. Preeclampsia and Future Cardiovascular Health A Systematic Review and Meta-Analysis. Circulation Quality and Outcomes. 2017 ]  PLAN:    In order of problems listed above:  TTE Follow up 3 months         Medication Adjustments/Labs and Tests Ordered: Current medicines are reviewed at length with the patient today.  Concerns  regarding medicines are outlined above.  Orders Placed This Encounter  Procedures   EKG 12-Lead   ECHOCARDIOGRAM COMPLETE   No orders of the defined types were placed in this encounter.   Patient Instructions  Medication Instructions:  No Changes In Medications at this time.  *If you need a refill on your cardiac medications before your next appointment, please call your pharmacy*  Testing/Procedures: Your physician has requested that you have an echocardiogram.  Echocardiography is a painless test that uses sound waves to create images of your heart. It provides your doctor with information about the size and shape of your heart and how well your hearts chambers and valves are working. You may receive an ultrasound enhancing agent through an IV if needed to better visualize your heart during the echo.This procedure takes approximately one hour. There are no restrictions for this procedure. This will take place at the [address], [address].   Follow-Up: At [facility], you and your health needs are our priority.  As part of our continuing mission to provide you with exceptional heart care, we have created designated Provider Care Teams.  These Care Teams include your primary Cardiologist (physician) and Advanced Practice Providers (APPs -  Physician Assistants and Nurse Practitioners) who all work together to provide you with the care you need, when you need it.  Your next appointment:   3 month(s)  The format for your next appointment:   In Person  Provider:   Maisie Fus, MD     Signed, Maisie Fus, MD  05/05/2021 10:27 AM    [facility]

## 2021-05-19 ENCOUNTER — Other Ambulatory Visit: Payer: Self-pay

## 2021-05-19 ENCOUNTER — Encounter: Payer: Self-pay | Admitting: Internal Medicine

## 2021-05-19 ENCOUNTER — Ambulatory Visit (HOSPITAL_COMMUNITY): Payer: Managed Care, Other (non HMO) | Attending: Cardiology

## 2021-05-19 DIAGNOSIS — I5030 Unspecified diastolic (congestive) heart failure: Secondary | ICD-10-CM | POA: Diagnosis not present

## 2021-05-19 DIAGNOSIS — I340 Nonrheumatic mitral (valve) insufficiency: Secondary | ICD-10-CM | POA: Diagnosis not present

## 2021-05-19 DIAGNOSIS — R011 Cardiac murmur, unspecified: Secondary | ICD-10-CM | POA: Diagnosis present

## 2021-05-19 DIAGNOSIS — I447 Left bundle-branch block, unspecified: Secondary | ICD-10-CM | POA: Diagnosis present

## 2021-05-19 LAB — ECHOCARDIOGRAM COMPLETE
Area-P 1/2: 3.6 cm2
S' Lateral: 2.4 cm

## 2021-06-05 LAB — HM MAMMOGRAPHY

## 2021-06-26 ENCOUNTER — Encounter: Payer: Self-pay | Admitting: Internal Medicine

## 2021-06-26 NOTE — Progress Notes (Signed)
Outside notes received. Information abstracted. Notes sent to scan.  

## 2021-07-02 ENCOUNTER — Telehealth: Payer: Self-pay | Admitting: Internal Medicine

## 2021-07-02 NOTE — Telephone Encounter (Signed)
Received fax from referring provider's office yesterday, [facility], requesting update on patient: ? ?"Hello! If I could please get a referral update on the following patient's, if they are on this list and they have had their study done, we do not have any record of it- so if you can fax it over, that would be much appreciated!" ? ?Patient saw Dr. Wyline Mood on 05/05/21, echo on 2/13 and has follow-up scheduled for 4/04 with Dr. Wyline Mood. ? ?Phone#: [phone] ?Fax#: [phone] ?

## 2021-07-02 NOTE — Telephone Encounter (Signed)
Information sent over to fax below to OBGYN office-  ?Office note, and ECHO sent.  ? ? ?

## 2021-07-08 ENCOUNTER — Ambulatory Visit: Payer: Managed Care, Other (non HMO) | Admitting: Internal Medicine

## 2021-07-08 ENCOUNTER — Encounter: Payer: Self-pay | Admitting: Internal Medicine

## 2021-07-08 VITALS — BP 136/84 | HR 78 | Ht 63.0 in | Wt 172.6 lb

## 2021-07-08 DIAGNOSIS — R011 Cardiac murmur, unspecified: Secondary | ICD-10-CM | POA: Diagnosis not present

## 2021-07-08 NOTE — Patient Instructions (Signed)

## 2021-07-08 NOTE — Progress Notes (Signed)
?Cardiology Office Note:   ? ?Date:  07/08/2021  ? ?ID:  Kathleen Ortiz, DOB May 12, 1970, MRN [id number] ? ?PCP:  Pincus SanesBurns, Stacy J, MD ?  ?[facility] Providers ?Cardiologist:  Maisie FusBranch, Yony Roulston E, MD    ? ?Referring MD: Pincus SanesBurns, Stacy J, MD  ? ?No chief complaint on file. ?Heart Murmur ? ?History of Present Illness:   ? ?Kathleen Ortiz is a 51 y.o. female with a hx of GERD, G2P2, referral form Dr. Lawerance BachBurns for heart murmur, ? ?She has no hx of heart disease. She notes sob with weather changes; thinking it may be more seasonal allergy. She has seasonal asthma.  She notes some heart fluttering. It can be once per week. She can get dizzy at the end of the day. No syncope.No congenital heart disease. She denies chest pain or SOB. She denies PND or orthopnea or LE edema. ? ?She had one pregnancy that was healthy. The second was preeclampsia. She had significant hypertension and needed to deliver at 25 weeks. This was 28 years ago. She's had no issues with blood pressure since then. No DM2 or gestational diabetes. ? ?Family hx- MGM had heart disease. No 1st degree relatives with heart disease ? ?Social Hx - she smoked cigarettes for 10-15 years; quit about 5 to 6 years ago. She is active. She works every day.  ? ?A1c 5.6% ?TC 155 ?LDL 88 ?TSH normal ? ?She has EKG showing LBBB with no prior EKG to compare ?  ? ?Interim Hx: ?Kathleen Ortiz feels well today. She has plantar fascitis. We reviewed her echo results. Her blood pressure is in a good range. ? ? ?Past Medical History:  ?Diagnosis Date  ? Chronic bronchitis (HCC)   ? Eczema   ? GERD (gastroesophageal reflux disease)   ? Mild persistent asthma 08/02/2018  ? Urticaria   ? ? ?Past Surgical History:  ?Procedure Laterality Date  ? TYMPANOSTOMY TUBE PLACEMENT    ? ? ?Current Medications: ?Current Meds  ?Medication Sig  ? albuterol (PROAIR HFA) 108 (90 Base) MCG/ACT inhaler Inhale 2 puffs into the lungs every 4 (four) hours as needed for wheezing or shortness of breath.  ?  fluticasone (FLONASE) 50 MCG/ACT nasal spray Place 2 sprays into both nostrils daily as needed for allergies or rhinitis.  ? levocetirizine (XYZAL) 5 MG tablet TAKE 1 TABLET BY MOUTH EVERY DAY IN THE EVENING  ? meloxicam (MOBIC) 15 MG tablet Take 1 tablet (15 mg total) by mouth daily.  ? omeprazole (PRILOSEC) 40 MG capsule Take 40 mg by mouth daily.  ? Probiotic Product (DIGESTIVE ADVANTAGE) CAPS 1 capsule  ? tiZANidine (ZANAFLEX) 2 MG tablet Take 1 tablet (2 mg total) by mouth at bedtime.  ? valACYclovir (VALTREX) 500 MG tablet Take by mouth.  ? Vitamin D, Ergocalciferol, (DRISDOL) 1.25 MG (50000 UNIT) CAPS capsule Take 1 capsule (50,000 Units total) by mouth every 7 (seven) days.  ?  ? ?Allergies:   Doxycycline, Shellfish allergy, and Latex  ? ?Social History  ? ?Socioeconomic History  ? Marital status: Single  ?  Spouse name: Not on file  ? Number of children: Not on file  ? Years of education: Not on file  ? Highest education level: Not on file  ?Occupational History  ? Not on file  ?Tobacco Use  ? Smoking status: Former  ?  Packs/day: 0.10  ?  Types: Cigarettes  ? Smokeless tobacco: Never  ?Vaping Use  ? Vaping Use: Never used  ?Substance and Sexual  Activity  ? Alcohol use: Yes  ? Drug use: No  ? Sexual activity: Not on file  ?Other Topics Concern  ? Not on file  ?Social History Narrative  ? No regular exercise  ?   ? ?Social Determinants of Health  ? ?Financial Resource Strain: Not on file  ?Food Insecurity: Not on file  ?Transportation Needs: Not on file  ?Physical Activity: Not on file  ?Stress: Not on file  ?Social Connections: Not on file  ?  ? ?Family History: ?The patient's family history includes Arthritis in her maternal grandmother and mother; Cancer in her mother and paternal grandfather; Diabetes in her maternal aunt and maternal grandmother; Heart disease in her maternal grandmother; Hypertension in her maternal grandmother and mother. ? ?ROS:   ?Please see the history of present illness.    ? All  other systems reviewed and are negative. ? ?EKGs/Labs/Other Studies Reviewed:   ? ?The following studies were reviewed today: ? ? ?EKG:  EKG is  ordered today.  The ekg ordered today demonstrates  ? ?EKG: 05/05/2021-NSR, LBBB ? ?Recent Labs: ?No results found for requested labs within last 8760 hours.  ?Recent Lipid Panel ?   ?Component Value Date/Time  ? CHOL 155 10/26/2018 0956  ? TRIG 78.0 10/26/2018 0956  ? HDL 51.50 10/26/2018 0956  ? CHOLHDL 3 10/26/2018 0956  ? VLDL 15.6 10/26/2018 0956  ? LDLCALC 88 10/26/2018 0956  ? ? ? ?Risk Assessment/Calculations:   ? The 10-year ASCVD risk score (Arnett DK, et al., 2019) is: 3.8% ?  Values used to calculate the score: ?    Age: 51 years ?    Sex: Female ?    Is Non-Hispanic African American: Yes ?    Diabetic: No ?    Tobacco smoker: Yes ?    Systolic Blood Pressure: 136 mmHg ?    Is BP treated: No ?    HDL Cholesterol: 51.5 mg/dL ?    Total Cholesterol: 155 mg/dL ? ? ?    ? ?Physical Exam:   ? ?VS:  ? ?Vitals:  ? 07/08/21 0823  ?BP: 136/84  ?Pulse: 78  ?SpO2: 99%  ? ? ? ?Wt Readings from Last 3 Encounters:  ?07/08/21 172 lb 9.6 oz (78.3 kg)  ?05/05/21 172 lb 3.2 oz (78.1 kg)  ?04/14/21 174 lb (78.9 kg)  ?  ? ?GEN:  Well nourished, well developed in no acute distress ?HEENT: Normal ?LYMPHATICS: No lymphadenopathy ?CARDIAC: RRR, soft 3/6 SEUM, rubs, gallops ?RESPIRATORY:  Clear to auscultation without rales, wheezing or rhonchi  ?ABDOMEN: Soft, non-tender, non-distended ?MUSCULOSKELETAL:  No edema; No deformity  ?SKIN: Warm and dry ?NEUROLOGIC:  Alert and oriented x 3 ?PSYCHIATRIC:  Normal affect  ? ?ASSESSMENT:   ? ?#Heart Murmur: Underwent an echo which was normal. Her LV and RV function were normal. She had only mild MR. Can consider re-evaluate in 3-5 years. No intrinsic mitral valve disease. We discussed that over time if she had symptoms of SOB, she can return for evaluation, otherwise can continue to follow with her PCP ? ?#Idiopathic LBBB: She had a new LBBB with  no prior ECG. She had no signs of CAD.  Echo was normal. Benign. ? ?Of note, women with hx of  pre-eclampsia have four fold increase in incident CHF and two fold increased risk in coronary disease. [Wu et al. Preeclampsia and Future Cardiovascular Health A Systematic Review and Meta-Analysis. Circulation Quality and Outcomes. 2017 ]. Will be important to continue lifestyle modifications and  yearly screening for CVD ? ?PLAN:   ? ?In order of problems listed above: ? ?No further recommendations ?Follow up PRN ? ?   ? ?   ?Medication Adjustments/Labs and Tests Ordered: ?Current medicines are reviewed at length with the patient today.  Concerns regarding medicines are outlined above.  ?No orders of the defined types were placed in this encounter. ? ?No orders of the defined types were placed in this encounter. ? ? ?There are no Patient Instructions on file for this visit.  ? ?Signed, ?Maisie Fus, MD  ?07/08/2021 8:45 AM    [facility] ?

## 2021-07-21 ENCOUNTER — Encounter: Payer: Self-pay | Admitting: Internal Medicine

## 2021-07-21 NOTE — Progress Notes (Signed)
Outside notes received. Information abstracted. Notes sent to scan.  

## 2022-03-13 ENCOUNTER — Ambulatory Visit (INDEPENDENT_AMBULATORY_CARE_PROVIDER_SITE_OTHER): Payer: BC Managed Care – PPO | Admitting: Internal Medicine

## 2022-03-13 ENCOUNTER — Encounter: Payer: Self-pay | Admitting: Internal Medicine

## 2022-03-13 VITALS — BP 126/86 | HR 86 | Temp 98.5°F | Ht 63.0 in | Wt 167.0 lb

## 2022-03-13 DIAGNOSIS — J069 Acute upper respiratory infection, unspecified: Secondary | ICD-10-CM

## 2022-03-13 DIAGNOSIS — J3089 Other allergic rhinitis: Secondary | ICD-10-CM

## 2022-03-13 DIAGNOSIS — J4530 Mild persistent asthma, uncomplicated: Secondary | ICD-10-CM | POA: Diagnosis not present

## 2022-03-13 HISTORY — DX: Acute upper respiratory infection, unspecified: J06.9

## 2022-03-13 MED ORDER — LEVOCETIRIZINE DIHYDROCHLORIDE 5 MG PO TABS: ORAL_TABLET | ORAL | 2 refills | Status: DC

## 2022-03-13 MED ORDER — PROMETHAZINE-DM 6.25-15 MG/5ML PO SYRP: 5.0000 mL | ORAL_SOLUTION | Freq: Four times a day (QID) | ORAL | 0 refills | Status: DC | PRN

## 2022-03-13 NOTE — Assessment & Plan Note (Signed)
She is outside window for treatment of flu/covid/RSV so viral testing was not performed today. She is about 1 week of symptoms and overall improving slightly. She is struggling to sleep due to severe coughing. Rx promethazine/dm for cough. Rx xyzal which she previously took for allergies. Stop zyrtec otc. She has flonase at home which she is not taking and advised to start this daily for next 4-5 days. Okay to use otc cold medicine. No indication for antibiotics or steroids.

## 2022-03-13 NOTE — Patient Instructions (Addendum)
We have sent in promethazine/dm that you can take at night time for the cough.  This is from a virus and will get better in the next 1-2 weeks.   We have refilled the xyzal today to take instead of zyrtec for allergies if you want.  Try to use the flonase for the next 3-4 days to help the drainage.

## 2022-03-13 NOTE — Progress Notes (Signed)
   Subjective:   Patient ID: Kathleen Ortiz, female    DOB: [birth year], 51 y.o.   MRN: [id number]  HPI The patient is a 51 YO female coming in for acute cough and fevers.   Review of Systems  Constitutional:  Positive for chills. Negative for activity change, appetite change, fatigue, fever and unexpected weight change.  HENT:  Positive for congestion and postnasal drip. Negative for ear discharge, ear pain, rhinorrhea, sinus pressure, sinus pain, sneezing, sore throat, tinnitus, trouble swallowing and voice change.   Eyes: Negative.   Respiratory:  Positive for cough. Negative for chest tightness, shortness of breath and wheezing.   Cardiovascular: Negative.   Gastrointestinal: Negative.   Musculoskeletal:  Positive for myalgias.  Neurological: Negative.     Objective:  Physical Exam Constitutional:      Appearance: She is well-developed.  HENT:     Head: Normocephalic and atraumatic.     Comments: Oropharynx with redness and clear drainage, nose with swollen turbinates, TMs normal bilaterally.  Neck:     Thyroid: No thyromegaly.  Cardiovascular:     Rate and Rhythm: Normal rate and regular rhythm.  Pulmonary:     Effort: Pulmonary effort is normal. No respiratory distress.     Breath sounds: Normal breath sounds. No wheezing or rales.  Abdominal:     Palpations: Abdomen is soft.  Musculoskeletal:        General: No tenderness.     Cervical back: Normal range of motion.  Lymphadenopathy:     Cervical: No cervical adenopathy.  Skin:    General: Skin is warm and dry.  Neurological:     Mental Status: She is alert and oriented to person, place, and time.     Vitals:   03/13/22 1344  BP: 126/86  Pulse: 86  Temp: 98.5 F (36.9 C)  TempSrc: Oral  SpO2: 97%  Weight: 167 lb (75.8 kg)  Height: 5\' 3"  (1.6 m)    Assessment & Plan:

## 2022-03-13 NOTE — Assessment & Plan Note (Signed)
She does not have current inhaler and declines rx for this today. She is not having asthma flare with this URI. No wheezing on exam or SOB. Continue allergy medicine and willing to do inhaler if needed.

## 2022-03-27 ENCOUNTER — Telehealth: Payer: Self-pay | Admitting: Internal Medicine

## 2022-03-27 NOTE — Telephone Encounter (Signed)
Patient called stating cold has not gotten better, her phlegm was clear it is now yellowish-brown. She still has a sore throat and she would like for a stronger cough medicine to be sent in for her. A good call back number is [phone].

## 2022-03-31 DIAGNOSIS — J019 Acute sinusitis, unspecified: Secondary | ICD-10-CM | POA: Diagnosis not present

## 2022-03-31 NOTE — Telephone Encounter (Signed)
Patient called and stated cold still hasn't improved and cough is deep and it is making her dizzy. She would like something sent in, has been advised to go to urgent care.

## 2022-04-02 NOTE — Telephone Encounter (Signed)
Patient seen at Urgent Care.  Wants follow up appointment for 04/07/22 @ 3:40 pm.

## 2022-04-07 ENCOUNTER — Encounter: Payer: Managed Care, Other (non HMO) | Admitting: Internal Medicine

## 2022-04-07 NOTE — Telephone Encounter (Signed)
Appointment booked

## 2022-04-07 NOTE — Progress Notes (Unsigned)
    Subjective:    Patient ID: Kathleen Ortiz, female    DOB: 08-25-1970, 52 y.o.   MRN: [id number]      HPI Kathleen Ortiz is here for No chief complaint on file.   She was seen 03/13/2022 for a URI.     Medications and allergies reviewed with patient and updated if appropriate.  Current Outpatient Medications on File Prior to Visit  Medication Sig Dispense Refill   albuterol (PROAIR HFA) 108 (90 Base) MCG/ACT inhaler Inhale 2 puffs into the lungs every 4 (four) hours as needed for wheezing or shortness of breath. 18 g 0   fluticasone (FLONASE) 50 MCG/ACT nasal spray Place 2 sprays into both nostrils daily as needed for allergies or rhinitis. 16 g 2   levocetirizine (XYZAL) 5 MG tablet TAKE 1 TABLET BY MOUTH EVERY DAY IN THE EVENING 30 tablet 2   meloxicam (MOBIC) 15 MG tablet Take 1 tablet (15 mg total) by mouth daily. 30 tablet 1   omeprazole (PRILOSEC) 40 MG capsule Take 40 mg by mouth daily.     Probiotic Product (DIGESTIVE ADVANTAGE) CAPS 1 capsule     promethazine-dextromethorphan (PROMETHAZINE-DM) 6.25-15 MG/5ML syrup Take 5 mLs by mouth 4 (four) times daily as needed. 118 mL 0   tiZANidine (ZANAFLEX) 2 MG tablet Take 1 tablet (2 mg total) by mouth at bedtime. 30 tablet 0   valACYclovir (VALTREX) 500 MG tablet Take by mouth.     Vitamin D, Ergocalciferol, (DRISDOL) 1.25 MG (50000 UNIT) CAPS capsule Take 1 capsule (50,000 Units total) by mouth every 7 (seven) days. 12 capsule 0   No current facility-administered medications on file prior to visit.    Review of Systems     Objective:  There were no vitals filed for this visit. BP Readings from Last 3 Encounters:  03/13/22 126/86  07/08/21 136/84  05/05/21 138/90   Wt Readings from Last 3 Encounters:  03/13/22 167 lb (75.8 kg)  07/08/21 172 lb 9.6 oz (78.3 kg)  05/05/21 172 lb 3.2 oz (78.1 kg)   There is no height or weight on file to calculate BMI.    Physical Exam         Assessment & Plan:    See  Problem List for Assessment and Plan of chronic medical problems.     This encounter was created in error - please disregard.

## 2022-04-08 NOTE — Patient Instructions (Signed)
      Blood work was ordered.   The lab is on the first floor.    Medications changes include :       A referral was ordered for XXX.     Someone will call you to schedule an appointment.    No follow-ups on file.  

## 2022-06-02 DIAGNOSIS — Z01419 Encounter for gynecological examination (general) (routine) without abnormal findings: Secondary | ICD-10-CM | POA: Diagnosis not present

## 2022-06-11 DIAGNOSIS — Z1231 Encounter for screening mammogram for malignant neoplasm of breast: Secondary | ICD-10-CM | POA: Diagnosis not present

## 2022-06-11 LAB — HM MAMMOGRAPHY

## 2022-06-19 ENCOUNTER — Encounter: Payer: Self-pay | Admitting: Internal Medicine

## 2022-06-19 NOTE — Progress Notes (Signed)
Outside notes received. Information abstracted. Notes sent to scan.  

## 2022-07-01 ENCOUNTER — Other Ambulatory Visit: Payer: Self-pay

## 2022-07-01 ENCOUNTER — Telehealth: Payer: Self-pay | Admitting: Internal Medicine

## 2022-07-01 DIAGNOSIS — Z1231 Encounter for screening mammogram for malignant neoplasm of breast: Secondary | ICD-10-CM

## 2022-07-01 NOTE — Telephone Encounter (Signed)
Patient needs a referral to [facility] for another mammogram of her left breast - she said it is an ultrasound and image  Patients number:  [phone]

## 2022-07-01 NOTE — Telephone Encounter (Signed)
Screening mammogram correct

## 2022-07-01 NOTE — Telephone Encounter (Signed)
Please clarify.  Looks like her last screening mammogram was 1 year ago.  Does she need a diagnostic mammogram?

## 2022-07-06 DIAGNOSIS — R922 Inconclusive mammogram: Secondary | ICD-10-CM | POA: Diagnosis not present

## 2022-07-06 LAB — HM MAMMOGRAPHY

## 2022-07-14 ENCOUNTER — Ambulatory Visit (INDEPENDENT_AMBULATORY_CARE_PROVIDER_SITE_OTHER): Payer: BC Managed Care – PPO | Admitting: Internal Medicine

## 2022-07-14 ENCOUNTER — Encounter: Payer: Self-pay | Admitting: Internal Medicine

## 2022-07-14 VITALS — BP 132/80 | HR 80 | Temp 99.1°F | Ht 63.0 in | Wt 171.0 lb

## 2022-07-14 DIAGNOSIS — R82998 Other abnormal findings in urine: Secondary | ICD-10-CM | POA: Insufficient documentation

## 2022-07-14 DIAGNOSIS — R519 Headache, unspecified: Secondary | ICD-10-CM

## 2022-07-14 DIAGNOSIS — M5440 Lumbago with sciatica, unspecified side: Secondary | ICD-10-CM | POA: Diagnosis not present

## 2022-07-14 DIAGNOSIS — G8929 Other chronic pain: Secondary | ICD-10-CM

## 2022-07-14 DIAGNOSIS — J3089 Other allergic rhinitis: Secondary | ICD-10-CM | POA: Diagnosis not present

## 2022-07-14 DIAGNOSIS — R300 Dysuria: Secondary | ICD-10-CM | POA: Diagnosis not present

## 2022-07-14 LAB — POCT URINALYSIS DIPSTICK
Bilirubin, UA: NEGATIVE
Blood, UA: NEGATIVE
Glucose, UA: NEGATIVE
Ketones, UA: NEGATIVE
Leukocytes, UA: NEGATIVE
Nitrite, UA: NEGATIVE
Protein, UA: NEGATIVE
Spec Grav, UA: 1.015 (ref 1.010–1.025)
Urobilinogen, UA: 0.2 E.U./dL
pH, UA: 6 (ref 5.0–8.0)

## 2022-07-14 MED ORDER — LEVOCETIRIZINE DIHYDROCHLORIDE 5 MG PO TABS: ORAL_TABLET | ORAL | 2 refills | Status: DC

## 2022-07-14 NOTE — Progress Notes (Signed)
Subjective:    Patient ID: Kathleen Ortiz, female    DOB: 1970-08-14, 52 y.o.   MRN: [id number]      HPI Kathleen Ortiz is here for  Chief Complaint  Patient presents with   Urinary Tract Infection    Real bad headaches , lower stomach and back hurts , dark urine and chemical smell , lower back and stomach has been hurting, thinks she may be dehydrated      Urine smells like chemicals - intermittently x months.  Also it is dark. No pain with urination.     Headaches - right posterior headache, sometimes frontal.  Drinks lots of water when it happens - does not drink enoughtypciall - 2 glasses per day. Had one Sunday, has one now.  Had one two weeks ago.  ? Allergies.  Takes anything for headaches - tyelnol, goody powder - does not take anything too often.    Lower back pressure - side of abdomen pressure - has had this in the past.  CT scan at that time was negative.  Also has some pain in the anterior lower left ribs.  GERD - flares just before menses.  Usually she takes an extra dose of her medication for 3 days and then it goes away.  She denies any relation to food or medications.  She feels it is hormonal.   Medications and allergies reviewed with patient and updated if appropriate.  Current Outpatient Medications on File Prior to Visit  Medication Sig Dispense Refill   albuterol (PROAIR HFA) 108 (90 Base) MCG/ACT inhaler Inhale 2 puffs into the lungs every 4 (four) hours as needed for wheezing or shortness of breath. 18 g 0   fluticasone (FLONASE) 50 MCG/ACT nasal spray Place 2 sprays into both nostrils daily as needed for allergies or rhinitis. 16 g 2   levocetirizine (XYZAL) 5 MG tablet TAKE 1 TABLET BY MOUTH EVERY DAY IN THE EVENING 30 tablet 2   meloxicam (MOBIC) 15 MG tablet Take 1 tablet (15 mg total) by mouth daily. 30 tablet 1   omeprazole (PRILOSEC) 40 MG capsule Take 40 mg by mouth daily.     Probiotic Product (DIGESTIVE ADVANTAGE) CAPS 1 capsule     tiZANidine  (ZANAFLEX) 2 MG tablet Take 1 tablet (2 mg total) by mouth at bedtime. 30 tablet 0   valACYclovir (VALTREX) 500 MG tablet Take by mouth.     Vitamin D, Ergocalciferol, (DRISDOL) 1.25 MG (50000 UNIT) CAPS capsule Take 1 capsule (50,000 Units total) by mouth every 7 (seven) days. 12 capsule 0   No current facility-administered medications on file prior to visit.    Review of Systems  Constitutional:  Negative for fever.  Eyes:  Positive for photophobia.  Gastrointestinal:  Positive for diarrhea. Negative for constipation.  Genitourinary:  Positive for urgency. Negative for dysuria, frequency and hematuria.  Musculoskeletal:  Positive for back pain.  Neurological:  Positive for dizziness and headaches.       Objective:   Vitals:   07/14/22 1617  BP: 132/80  Pulse: 80  Temp: 99.1 F (37.3 C)  SpO2: 98%   BP Readings from Last 3 Encounters:  07/14/22 132/80  03/13/22 126/86  07/08/21 136/84   Wt Readings from Last 3 Encounters:  07/14/22 171 lb (77.6 kg)  03/13/22 167 lb (75.8 kg)  07/08/21 172 lb 9.6 oz (78.3 kg)   Body mass index is 30.29 kg/m.    Physical Exam Constitutional:      General:  She is not in acute distress.    Appearance: Normal appearance. She is not ill-appearing.  HENT:     Head: Normocephalic and atraumatic.  Abdominal:     General: There is no distension.     Palpations: Abdomen is soft.     Tenderness: There is no abdominal tenderness.  Musculoskeletal:        General: Tenderness (Tenderness with palpation left hand carrier lower rib.  No tenderness bilateral lower back, but that is where she typically has the discomfort when she has it) present.     Right lower leg: No edema.     Left lower leg: No edema.  Skin:    General: Skin is warm and dry.     Findings: No rash.  Neurological:     Mental Status: She is alert. Mental status is at baseline.  Psychiatric:        Mood and Affect: Mood normal.            Assessment & Plan:     See Problem List for Assessment and Plan of chronic medical problems.

## 2022-07-14 NOTE — Assessment & Plan Note (Signed)
Chronic Continue xyzal 5 mg hs

## 2022-07-14 NOTE — Patient Instructions (Addendum)
        Increase water intake.    Let me know if your headaches do not improve.    Let me know if your back/rib pain does not improve.    Schedule a physical exam.

## 2022-07-14 NOTE — Assessment & Plan Note (Signed)
Has b/l lower back pain that sounds muscular in nature No spinal pain Pain with movement-feels stiff if she is not moving May be related to think she does at the gym Can try heat, massage, stretching She will monitor and try to see if there is a pattern for when this comes Also likely related to left lower anterior rib pain

## 2022-07-14 NOTE — Assessment & Plan Note (Signed)
Acute Dark in color and has an abnormal-chemical smell No increase in frequency or urgency, no blood in the urine Urine dip negative She is concerned because she has seen some specks in the urine Likely dehydration Will check urine analysis and urine culture Increase water intake

## 2022-07-14 NOTE — Assessment & Plan Note (Signed)
new Having frequent headaches over the past month No obvious pattern Discussed several of the more common causes of headaches She does have to be chronically dehydrated which is a possible cause Posterior headaches may be related to her neck Weather, allergies could also be playing a role Not taking medication too frequently so doubt rebound headaches No history of migraines so doubt migraines at this time Increase water intake Start to keep a log to try to find a pattern Over-the-counter pain medications as needed Call if no improvement

## 2022-07-15 LAB — URINALYSIS, ROUTINE W REFLEX MICROSCOPIC
Bilirubin Urine: NEGATIVE
Hgb urine dipstick: NEGATIVE
Ketones, ur: NEGATIVE
Leukocytes,Ua: NEGATIVE
Nitrite: NEGATIVE
RBC / HPF: NONE SEEN (ref 0–?)
Specific Gravity, Urine: <=1.005 — AB (ref 1.000–1.030)
Total Protein, Urine: NEGATIVE
Urine Glucose: NEGATIVE
Urobilinogen, UA: 0.2 (ref 0.0–1.0)
pH: 6 (ref 5.0–8.0)

## 2022-07-16 LAB — CULTURE, URINE COMPREHENSIVE

## 2022-07-16 MED ORDER — CEPHALEXIN 500 MG PO CAPS: 500.0000 mg | ORAL_CAPSULE | Freq: Two times a day (BID) | ORAL | 0 refills | Status: DC

## 2022-07-16 NOTE — Addendum Note (Signed)
Addended by: Pincus Sanes on: 07/16/2022 02:08 PM   Modules accepted: Orders

## 2022-08-20 DIAGNOSIS — K219 Gastro-esophageal reflux disease without esophagitis: Secondary | ICD-10-CM | POA: Diagnosis not present

## 2022-11-04 ENCOUNTER — Ambulatory Visit (INDEPENDENT_AMBULATORY_CARE_PROVIDER_SITE_OTHER): Payer: BC Managed Care – PPO | Admitting: Internal Medicine

## 2022-11-04 VITALS — BP 112/98 | HR 84 | Temp 98.5°F | Ht 63.0 in | Wt 165.0 lb

## 2022-11-04 DIAGNOSIS — J3089 Other allergic rhinitis: Secondary | ICD-10-CM | POA: Diagnosis not present

## 2022-11-04 DIAGNOSIS — H68103 Unspecified obstruction of Eustachian tube, bilateral: Secondary | ICD-10-CM | POA: Diagnosis not present

## 2022-11-04 DIAGNOSIS — J4530 Mild persistent asthma, uncomplicated: Secondary | ICD-10-CM

## 2022-11-04 DIAGNOSIS — E559 Vitamin D deficiency, unspecified: Secondary | ICD-10-CM | POA: Diagnosis not present

## 2022-11-04 DIAGNOSIS — L299 Pruritus, unspecified: Secondary | ICD-10-CM | POA: Diagnosis not present

## 2022-11-04 MED ORDER — TRIAMCINOLONE ACETONIDE 55 MCG/ACT NA AERO: 2.0000 | INHALATION_SPRAY | Freq: Every day | NASAL | 12 refills | Status: AC

## 2022-11-04 MED ORDER — GUAIFENESIN ER 600 MG PO TB12: 1200.0000 mg | ORAL_TABLET | Freq: Two times a day (BID) | ORAL | 1 refills | Status: DC | PRN

## 2022-11-04 MED ORDER — TRIAMCINOLONE ACETONIDE 0.1 % EX CREA: 1.0000 | TOPICAL_CREAM | Freq: Two times a day (BID) | CUTANEOUS | 0 refills | Status: AC

## 2022-11-04 NOTE — Progress Notes (Signed)
Patient ID: Kathleen Ortiz, female   DOB: [birth year], 52 y.o.   MRN: [id number]        Chief Complaint: follow up allergies, eustachian tube dysfunction, ear itching, low vit d, asthma       HPI:  Kathleen Ortiz is a 52 y.o. female Does have several wks ongoing nasal allergy symptoms with clearish congestion, itch and sneezing, without fever, pain, ST, cough, swelling or wheezing.  Also with several days bilat ear popping and crackling with some ear pressure feeling, with some radiation to the neck as well.  Also with right ear canal itching without pain, swelling, for some reason worse at night to sleep, hard to scratch.         Wt Readings from Last 3 Encounters:  11/04/22 165 lb (74.8 kg)  07/14/22 171 lb (77.6 kg)  03/13/22 167 lb (75.8 kg)   BP Readings from Last 3 Encounters:  11/04/22 (!) 112/98  07/14/22 132/80  03/13/22 126/86         Past Medical History:  Diagnosis Date   Chronic bronchitis (HCC)    Eczema    GERD (gastroesophageal reflux disease)    Mild persistent asthma 08/02/2018   URI (upper respiratory infection) 03/13/2022   Urticaria    Past Surgical History:  Procedure Laterality Date   TYMPANOSTOMY TUBE PLACEMENT      reports that she has quit smoking. Her smoking use included cigarettes. She has never used smokeless tobacco. She reports current alcohol use. She reports that she does not use drugs. family history includes Arthritis in her maternal grandmother and mother; Cancer in her mother and paternal grandfather; Diabetes in her maternal aunt and maternal grandmother; Heart disease in her maternal grandmother; Hypertension in her maternal grandmother and mother. Allergies  Allergen Reactions   Doxycycline Other (See Comments)    headache   Shellfish Allergy    Latex Rash   Current Outpatient Medications on File Prior to Visit  Medication Sig Dispense Refill   albuterol (PROAIR HFA) 108 (90 Base) MCG/ACT inhaler Inhale 2 puffs into the lungs every  4 (four) hours as needed for wheezing or shortness of breath. 18 g 0   cephALEXin (KEFLEX) 500 MG capsule Take 1 capsule (500 mg total) by mouth 2 (two) times daily. 14 capsule 0   fluticasone (FLONASE) 50 MCG/ACT nasal spray Place 2 sprays into both nostrils daily as needed for allergies or rhinitis. 16 g 2   levocetirizine (XYZAL) 5 MG tablet TAKE 1 TABLET BY MOUTH EVERY DAY IN THE EVENING 30 tablet 2   omeprazole (PRILOSEC) 40 MG capsule Take 40 mg by mouth daily.     Probiotic Product (DIGESTIVE ADVANTAGE) CAPS 1 capsule     valACYclovir (VALTREX) 500 MG tablet Take by mouth.     Vitamin D, Ergocalciferol, (DRISDOL) 1.25 MG (50000 UNIT) CAPS capsule Take 1 capsule (50,000 Units total) by mouth every 7 (seven) days. 12 capsule 0   No current facility-administered medications on file prior to visit.        ROS:  All others reviewed and negative.  Objective        PE:  BP (!) 112/98 (BP Location: Left Arm, Patient Position: Sitting, Cuff Size: Normal)   Pulse 84   Temp 98.5 F (36.9 C) (Oral)   Ht 5\' 3"  (1.6 m)   Wt 165 lb (74.8 kg)   LMP 10/02/2022 (Approximate)   SpO2 99%   BMI 29.23 kg/m  Constitutional: Pt appears in NAD               HENT: Head: NCAT.                Right Ear: External ear normal.                 Left Ear: External ear normal. Bilat tm's with mild erythema.  Max sinus areas non tender.  Pharynx with mild erythema, no exudate               Eyes: . Pupils are equal, round, and reactive to light. Conjunctivae and EOM are normal               Nose: without d/c or deformity               Neck: Neck supple. Gross normal ROM               Cardiovascular: Normal rate and regular rhythm.                 Pulmonary/Chest: Effort normal and breath sounds without rales or wheezing.                Neurological: Pt is alert. At baseline orientation, motor grossly intact               Skin: Skin is warm. No rashes, no other new lesions, LE edema - none                Psychiatric: Pt behavior is normal without agitation   Micro: none  Cardiac tracings I have personally interpreted today:  none  Pertinent Radiological findings (summarize): none   Lab Results  Component Value Date   WBC 5.0 03/19/2020   HGB 11.9 (L) 03/19/2020   HCT 35.5 (L) 03/19/2020   PLT 206.0 03/19/2020   GLUCOSE 93 10/26/2018   CHOL 155 10/26/2018   TRIG 78.0 10/26/2018   HDL 51.50 10/26/2018   LDLCALC 88 10/26/2018   ALT 12 10/26/2018   AST 15 10/26/2018   NA 138 10/26/2018   K 4.0 10/26/2018   CL 104 10/26/2018   CREATININE 0.76 10/26/2018   BUN 8 10/26/2018   CO2 27 10/26/2018   TSH 0.63 10/26/2018   HGBA1C 5.6 10/26/2018   Assessment/Plan:  Kathleen Ortiz is a 52 y.o. Black or African American [2] female with  has a past medical history of Chronic bronchitis (HCC), Eczema, GERD (gastroesophageal reflux disease), Mild persistent asthma (08/02/2018), URI (upper respiratory infection) (03/13/2022), and Urticaria.  Perennial and seasonal allergic rhinitis Mild to mod with seasonal flare it seems,, for add nasacort asd,  to f/u any worsening symptoms or concerns  Blocked Eustachian tube, bilateral Also Mild to mod, for otc mucinex bid prn,  to f/u any worsening symptoms or concerns  Vitamin D deficiency Last vitamin D Lab Results  Component Value Date   VD25OH 21.96 (L) 03/19/2020   Low, reminded to start oral replacement   Mild persistent asthma Overall stable, to continue inhaler prn  Pruritus Very mild right ear canal, exam benign, for triam cr prn  Followup: Return if symptoms worsen or fail to improve.  Oliver Barre, MD 11/07/2022 6:57 AM [facility] Internal Medicine

## 2022-11-04 NOTE — Patient Instructions (Signed)
Please take all new medication as prescribed  - the nasacort, and mucinex for clogged up ears  Please continue all other medications as before, including the antihistamine  Please take all new medication as prescribed  - the mild steroid cream for the ears as needed  Please have the pharmacy call with any other refills you may need.  Please continue your efforts at being more active, low cholesterol diet, and weight control  Please keep your appointments with your specialists as you may have planned

## 2022-11-07 ENCOUNTER — Encounter: Payer: Self-pay | Admitting: Internal Medicine

## 2022-11-07 NOTE — Assessment & Plan Note (Signed)
Overall stable, to continue inhaler prn

## 2022-11-07 NOTE — Assessment & Plan Note (Signed)
Also Mild to mod, for otc mucinex bid prn,  to f/u any worsening symptoms or concerns

## 2022-11-07 NOTE — Assessment & Plan Note (Signed)
Very mild right ear canal, exam benign, for triam cr prn

## 2022-11-07 NOTE — Assessment & Plan Note (Signed)
Mild to mod with seasonal flare it seems,, for add nasacort asd,  to f/u any worsening symptoms or concerns

## 2022-11-07 NOTE — Assessment & Plan Note (Signed)
Last vitamin D Lab Results  Component Value Date   VD25OH 21.96 (L) 03/19/2020   Low, reminded to start oral replacement

## 2022-11-19 ENCOUNTER — Encounter: Payer: Self-pay | Admitting: Internal Medicine

## 2022-11-21 IMAGING — DX DG LUMBAR SPINE COMPLETE 4+V
5 series · 5 of 5 positions shown · non-contrast
Comparison: 02/06/2020

CLINICAL DATA: Back pain

EXAM:
LUMBAR SPINE - COMPLETE 4+ VIEW

[l-spine ap]
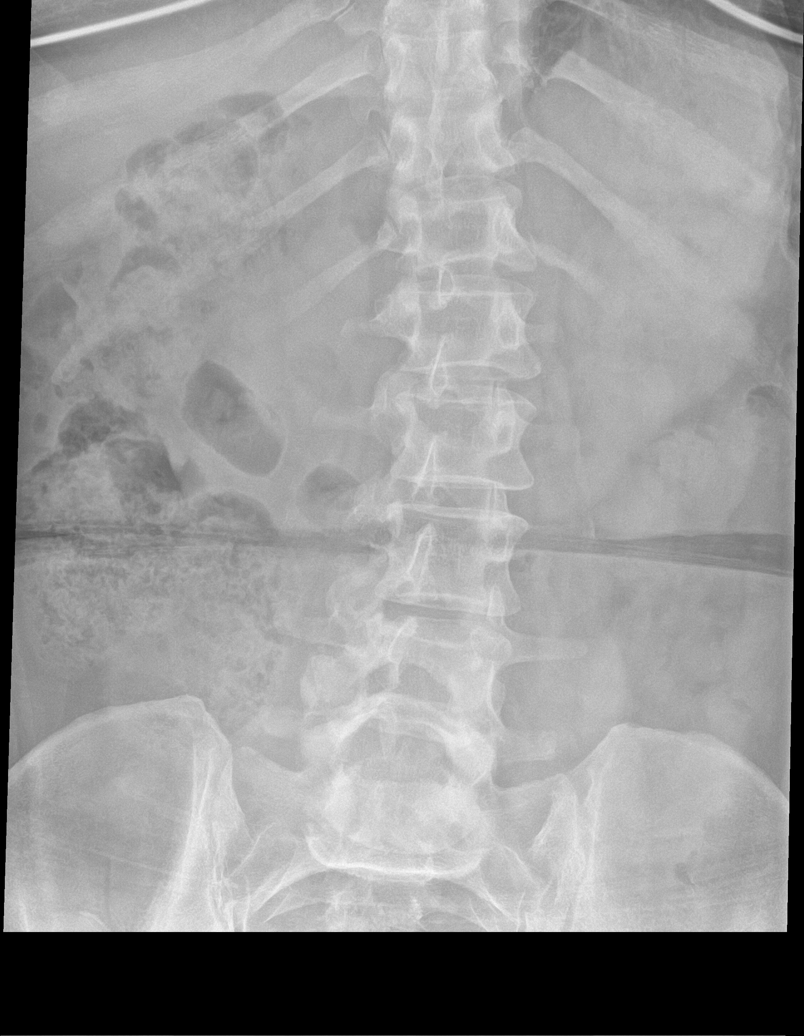

[l-spine obl (1 of 2)]
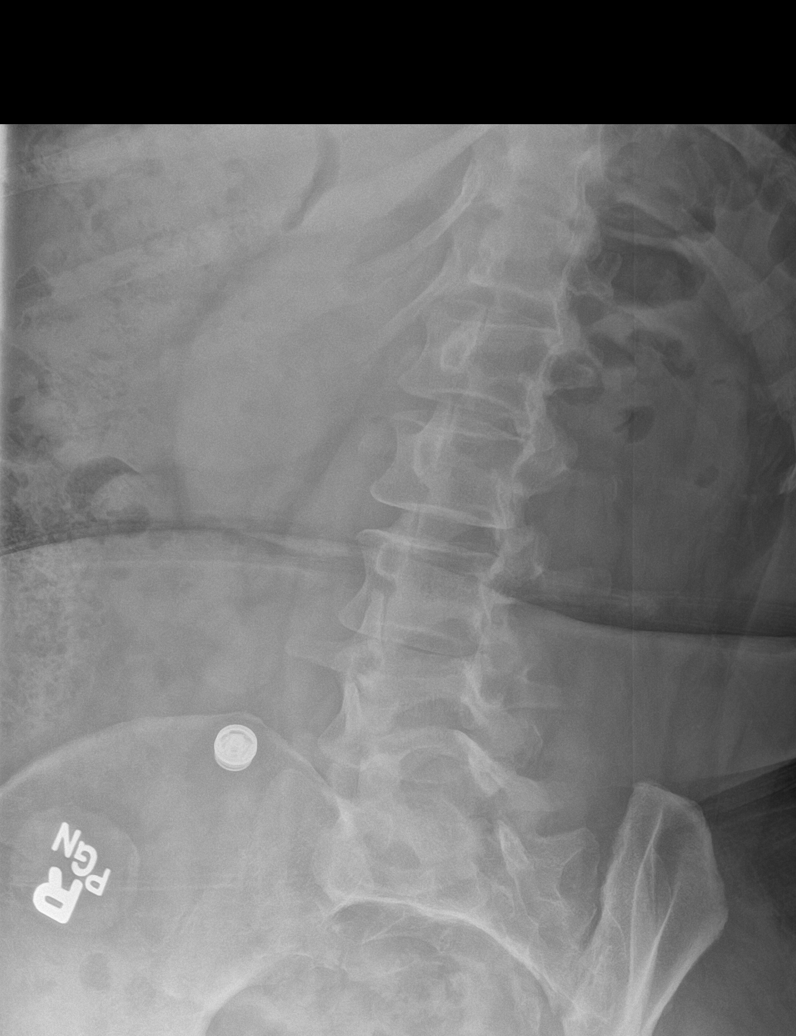

[l-spine obl (2 of 2)]
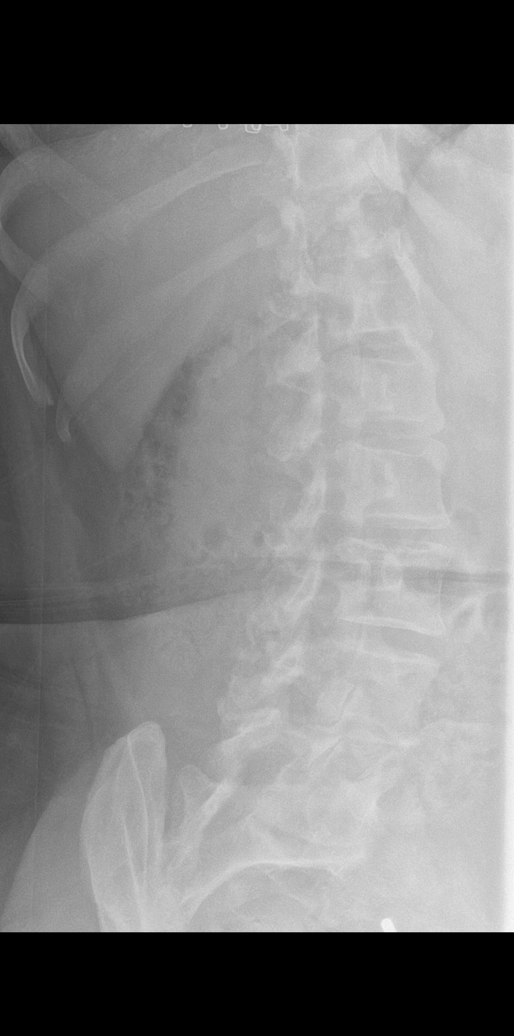

[l-spine lateral]
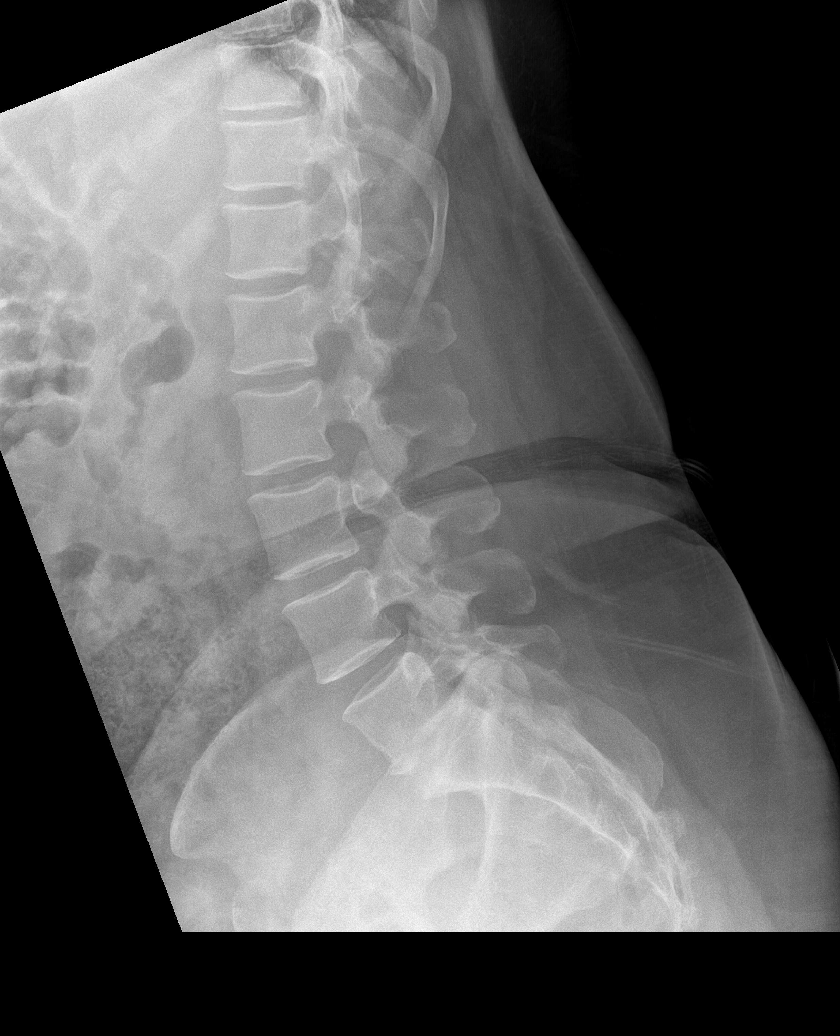

[l-spine spot]
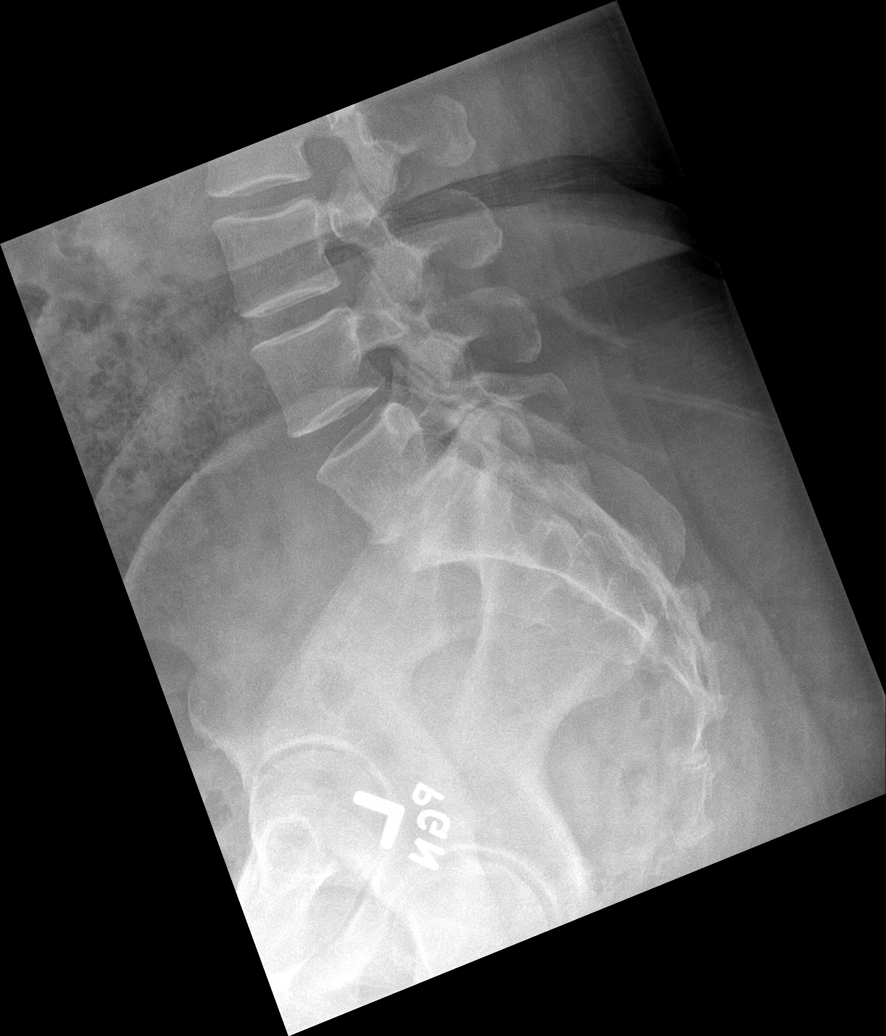

[5 of 5 positions shown; findings below may reference images not displayed]

FINDINGS: Mild levocurvature. Stable vertebral body heights and alignment.
Mild degenerative endplate irregularity at L5-S1. No substantial
disc space narrowing. No significant facet hypertrophy.
IMPRESSION: Mild degenerative changes at L5-S1. No substantial change since
prior study.

## 2023-01-05 ENCOUNTER — Encounter: Payer: Self-pay | Admitting: Internal Medicine

## 2023-01-05 DIAGNOSIS — R739 Hyperglycemia, unspecified: Secondary | ICD-10-CM | POA: Insufficient documentation

## 2023-01-05 NOTE — Progress Notes (Unsigned)
Subjective:    Patient ID: Kathleen Ortiz, female    DOB: 09/11/1970, 52 y.o.   MRN: [id number]      HPI Kathleen Ortiz is here for a Physical exam and her chronic medical problems.   For the past month - eye twitching on left x 1 week, now right eye x 2 days.     Toes hurt bad - ? Nerve issues.  Corn on left lateral foot.  Numbness in b/l medial aspects of feet.  She has seen a foot doctor and was diagnosed with bilateral plantar fasciitis.  She does feel achy, feels hot and cold at times, anxious and is not sleeping well.  She is taking fluoxetine which is prescribed by her gynecologist for some of her symptoms.  She does not feel like it is working all that well.  Medications and allergies reviewed with patient and updated if appropriate.  Current Outpatient Medications on File Prior to Visit  Medication Sig Dispense Refill   albuterol (PROAIR HFA) 108 (90 Base) MCG/ACT inhaler Inhale 2 puffs into the lungs every 4 (four) hours as needed for wheezing or shortness of breath. 18 g 0   FLUoxetine (PROZAC) 40 MG capsule Take 40 mg by mouth daily.     fluticasone (FLONASE) 50 MCG/ACT nasal spray Place 2 sprays into both nostrils daily as needed for allergies or rhinitis. 16 g 2   levocetirizine (XYZAL) 5 MG tablet TAKE 1 TABLET BY MOUTH EVERY DAY IN THE EVENING 30 tablet 2   omeprazole (PRILOSEC) 40 MG capsule Take 40 mg by mouth daily.     Probiotic Product (DIGESTIVE ADVANTAGE) CAPS 1 capsule     triamcinolone (NASACORT) 55 MCG/ACT AERO nasal inhaler Place 2 sprays into the nose daily. 1 each 12   triamcinolone cream (KENALOG) 0.1 % Apply 1 Application topically 2 (two) times daily. 30 g 0   valACYclovir (VALTREX) 500 MG tablet Take by mouth.     No current facility-administered medications on file prior to visit.    Review of Systems  Constitutional:  Negative for fever.  Eyes:  Positive for visual disturbance (getting worse - will see eye doctor).  Respiratory:  Negative for  cough, shortness of breath and wheezing.   Cardiovascular:  Negative for chest pain, palpitations and leg swelling.  Gastrointestinal:  Negative for abdominal pain, blood in stool, constipation and diarrhea.       No gerd  Genitourinary:  Negative for dysuria.  Musculoskeletal:  Negative for arthralgias and back pain.       Feet pain, achy all over - muscle and joints  Skin:  Negative for rash.  Neurological:  Positive for numbness and headaches (occ). Negative for light-headedness.  Psychiatric/Behavioral:  Positive for sleep disturbance. Negative for dysphoric mood. The patient is nervous/anxious.        Objective:   Vitals:   01/06/23 0816  BP: 126/74  Pulse: 68  Temp: 98.1 F (36.7 C)  SpO2: 97%   Filed Weights   01/06/23 0816  Weight: 162 lb (73.5 kg)   Body mass index is 28.7 kg/m.  BP Readings from Last 3 Encounters:  01/06/23 126/74  11/04/22 (!) 112/98  07/14/22 132/80    Wt Readings from Last 3 Encounters:  01/06/23 162 lb (73.5 kg)  11/04/22 165 lb (74.8 kg)  07/14/22 171 lb (77.6 kg)       Physical Exam Constitutional: She appears well-developed and well-nourished. No distress.  HENT:  Head: Normocephalic and atraumatic.  Right Ear: External ear normal. Normal ear canal and TM Left Ear: External ear normal.  Normal ear canal and TM Mouth/Throat: Oropharynx is clear and moist.  Eyes: Conjunctivae normal.  Neck: Neck supple. No tracheal deviation present. No thyromegaly present.  No carotid bruit  Cardiovascular: Normal rate, regular rhythm and normal heart sounds.   No murmur heard.  No edema. Pulmonary/Chest: Effort normal and breath sounds normal. No respiratory distress. She has no wheezes. She has no rales.  Breast: deferred   Abdominal: Soft. She exhibits no distension. There is no tenderness.  Lymphadenopathy: She has no cervical adenopathy.  Skin: Skin is warm and dry. She is not diaphoretic.  Psychiatric: She has a normal mood and  affect. Her behavior is normal.     Lab Results  Component Value Date   WBC 5.0 03/19/2020   HGB 11.9 (L) 03/19/2020   HCT 35.5 (L) 03/19/2020   PLT 206.0 03/19/2020   GLUCOSE 93 10/26/2018   CHOL 155 10/26/2018   TRIG 78.0 10/26/2018   HDL 51.50 10/26/2018   LDLCALC 88 10/26/2018   ALT 12 10/26/2018   AST 15 10/26/2018   NA 138 10/26/2018   K 4.0 10/26/2018   CL 104 10/26/2018   CREATININE 0.76 10/26/2018   BUN 8 10/26/2018   CO2 27 10/26/2018   TSH 0.63 10/26/2018   HGBA1C 5.6 10/26/2018         Assessment & Plan:   Physical exam: Screening blood work  ordered Exercise regular Weight is okay Substance abuse  none   Reviewed recommended immunizations.   Health Maintenance  Topic Date Due   Cervical Cancer Screening (HPV/Pap Cotest)  02/07/2021   Zoster Vaccines- Shingrix (1 of 2) Never done   INFLUENZA VACCINE  11/05/2022   COVID-19 Vaccine (1 - 2023-24 season) 01/22/2023 (Originally 12/06/2022)   MAMMOGRAM  07/06/2023   Colonoscopy  04/01/2026   DTaP/Tdap/Td (3 - Td or Tdap) 10/25/2028   HPV VACCINES  Aged Out   Hepatitis C Screening  Discontinued   HIV Screening  Discontinued          See Problem List for Assessment and Plan of chronic medical problems.

## 2023-01-05 NOTE — Patient Instructions (Addendum)
Blood work was ordered.   The lab is on the first floor.    Medications changes include :  none      Return in about 6 months (around 07/07/2023) for Physical Exam.   Health Maintenance, Female Adopting a healthy lifestyle and getting preventive care are important in promoting health and wellness. Ask your health care provider about: The right schedule for you to have regular tests and exams. Things you can do on your own to prevent diseases and keep yourself healthy. What should I know about diet, weight, and exercise? Eat a healthy diet  Eat a diet that includes plenty of vegetables, fruits, low-fat dairy products, and lean protein. Do not eat a lot of foods that are high in solid fats, added sugars, or sodium. Maintain a healthy weight Body mass index (BMI) is used to identify weight problems. It estimates body fat based on height and weight. Your health care provider can help determine your BMI and help you achieve or maintain a healthy weight. Get regular exercise Get regular exercise. This is one of the most important things you can do for your health. Most adults should: Exercise for at least 150 minutes each week. The exercise should increase your heart rate and make you sweat (moderate-intensity exercise). Do strengthening exercises at least twice a week. This is in addition to the moderate-intensity exercise. Spend less time sitting. Even light physical activity can be beneficial. Watch cholesterol and blood lipids Have your blood tested for lipids and cholesterol at 51 years of age, then have this test every 5 years. Have your cholesterol levels checked more often if: Your lipid or cholesterol levels are high. You are older than 51 years of age. You are at high risk for heart disease. What should I know about cancer screening? Depending on your health history and family history, you may need to have cancer screening at various ages. This may include screening  for: Breast cancer. Cervical cancer. Colorectal cancer. Skin cancer. Lung cancer. What should I know about heart disease, diabetes, and high blood pressure? Blood pressure and heart disease High blood pressure causes heart disease and increases the risk of stroke. This is more likely to develop in people who have high blood pressure readings or are overweight. Have your blood pressure checked: Every 3-5 years if you are 51-51 years of age. Every year if you are 51 years old or older. Diabetes Have regular diabetes screenings. This checks your fasting blood sugar level. Have the screening done: Once every three years after age 51 if you are at a normal weight and have a low risk for diabetes. More often and at a younger age if you are overweight or have a high risk for diabetes. What should I know about preventing infection? Hepatitis B If you have a higher risk for hepatitis B, you should be screened for this virus. Talk with your health care provider to find out if you are at risk for hepatitis B infection. Hepatitis C Testing is recommended for: Everyone born from [birth year] through 1964. Anyone with known risk factors for hepatitis C. Sexually transmitted infections (STIs) Get screened for STIs, including gonorrhea and chlamydia, if: You are sexually active and are younger than 51 years of age. You are older than 51 years of age and your health care provider tells you that you are at risk for this type of infection. Your sexual activity has changed since you were last screened, and you are at  increased risk for chlamydia or gonorrhea. Ask your health care provider if you are at risk. Ask your health care provider about whether you are at high risk for HIV. Your health care provider may recommend a prescription medicine to help prevent HIV infection. If you choose to take medicine to prevent HIV, you should first get tested for HIV. You should then be tested every 3 months for as long as you  are taking the medicine. Pregnancy If you are about to stop having your period (premenopausal) and you may become pregnant, seek counseling before you get pregnant. Take 400 to 800 micrograms (mcg) of folic acid every day if you become pregnant. Ask for birth control (contraception) if you want to prevent pregnancy. Osteoporosis and menopause Osteoporosis is a disease in which the bones lose minerals and strength with aging. This can result in bone fractures. If you are 51 years old or older, or if you are at risk for osteoporosis and fractures, ask your health care provider if you should: Be screened for bone loss. Take a calcium or vitamin D supplement to lower your risk of fractures. Be given hormone replacement therapy (HRT) to treat symptoms of menopause. Follow these instructions at home: Alcohol use Do not drink alcohol if: Your health care provider tells you not to drink. You are pregnant, may be pregnant, or are planning to become pregnant. If you drink alcohol: Limit how much you have to: 0-1 drink a day. Know how much alcohol is in your drink. In the [place] one drink equals one 12 oz bottle of beer (355 mL), one 5 oz glass of wine (148 mL), or one 1 oz glass of hard liquor (44 mL). Lifestyle Do not use any products that contain nicotine or tobacco. These products include cigarettes, chewing tobacco, and vaping devices, such as e-cigarettes. If you need help quitting, ask your health care provider. Do not use street drugs. Do not share needles. Ask your health care provider for help if you need support or information about quitting drugs. General instructions Schedule regular health, dental, and eye exams. Stay current with your vaccines. Tell your health care provider if: You often feel depressed. You have ever been abused or do not feel safe at home. Summary Adopting a healthy lifestyle and getting preventive care are important in promoting health and wellness. Follow your  health care provider's instructions about healthy diet, exercising, and getting tested or screened for diseases. Follow your health care provider's instructions on monitoring your cholesterol and blood pressure. This information is not intended to replace advice given to you by your health care provider. Make sure you discuss any questions you have with your health care provider. Document Revised: 08/12/2020 Document Reviewed: 08/12/2020 Elsevier Patient Education  2024 [employer].

## 2023-01-06 ENCOUNTER — Ambulatory Visit (INDEPENDENT_AMBULATORY_CARE_PROVIDER_SITE_OTHER): Payer: BC Managed Care – PPO | Admitting: Internal Medicine

## 2023-01-06 VITALS — BP 126/74 | HR 68 | Temp 98.1°F | Ht 63.0 in | Wt 162.0 lb

## 2023-01-06 DIAGNOSIS — E6609 Other obesity due to excess calories: Secondary | ICD-10-CM | POA: Diagnosis not present

## 2023-01-06 DIAGNOSIS — E559 Vitamin D deficiency, unspecified: Secondary | ICD-10-CM | POA: Diagnosis not present

## 2023-01-06 DIAGNOSIS — K219 Gastro-esophageal reflux disease without esophagitis: Secondary | ICD-10-CM | POA: Diagnosis not present

## 2023-01-06 DIAGNOSIS — Z0000 Encounter for general adult medical examination without abnormal findings: Secondary | ICD-10-CM

## 2023-01-06 DIAGNOSIS — Z6832 Body mass index (BMI) 32.0-32.9, adult: Secondary | ICD-10-CM

## 2023-01-06 DIAGNOSIS — R202 Paresthesia of skin: Secondary | ICD-10-CM | POA: Diagnosis not present

## 2023-01-06 DIAGNOSIS — R200 Anesthesia of skin: Secondary | ICD-10-CM

## 2023-01-06 DIAGNOSIS — E66811 Obesity, class 1: Secondary | ICD-10-CM

## 2023-01-06 DIAGNOSIS — R739 Hyperglycemia, unspecified: Secondary | ICD-10-CM | POA: Diagnosis not present

## 2023-01-06 DIAGNOSIS — N951 Menopausal and female climacteric states: Secondary | ICD-10-CM

## 2023-01-06 LAB — LIPID PANEL
Cholesterol: 165 mg/dL (ref 0–200)
HDL: 68.9 mg/dL (ref >39.00)
LDL Cholesterol: 81 mg/dL (ref 0–99)
NonHDL: 96.59
Total CHOL/HDL Ratio: 2
Triglycerides: 76 mg/dL (ref 0.0–149.0)
VLDL: 15.2 mg/dL (ref 0.0–40.0)

## 2023-01-06 LAB — COMPREHENSIVE METABOLIC PANEL
ALT: 12 U/L (ref 0–35)
AST: 17 U/L (ref 0–37)
Albumin: 4.3 g/dL (ref 3.5–5.2)
Alkaline Phosphatase: 82 U/L (ref 39–117)
BUN: 8 mg/dL (ref 6–23)
CO2: 25 meq/L (ref 19–32)
Calcium: 9.2 mg/dL (ref 8.4–10.5)
Chloride: 105 meq/L (ref 96–112)
Creatinine, Ser: 0.83 mg/dL (ref 0.40–1.20)
GFR: 81.62 mL/min (ref >60.00)
Glucose, Bld: 82 mg/dL (ref 70–99)
Potassium: 3.8 meq/L (ref 3.5–5.1)
Sodium: 138 meq/L (ref 135–145)
Total Bilirubin: 0.8 mg/dL (ref 0.2–1.2)
Total Protein: 7.8 g/dL (ref 6.0–8.3)

## 2023-01-06 LAB — CBC WITH DIFFERENTIAL/PLATELET
Basophils Absolute: 0 10*3/uL (ref 0.0–0.1)
Basophils Relative: 0.7 % (ref 0.0–3.0)
Eosinophils Absolute: 0.1 10*3/uL (ref 0.0–0.7)
Eosinophils Relative: 1.5 % (ref 0.0–5.0)
HCT: 37.4 % (ref 36.0–46.0)
Hemoglobin: 12.3 g/dL (ref 12.0–15.0)
Lymphocytes Relative: 32.8 % (ref 12.0–46.0)
Lymphs Abs: 1.4 10*3/uL (ref 0.7–4.0)
MCHC: 32.8 g/dL (ref 30.0–36.0)
MCV: 91.2 fL (ref 78.0–100.0)
Monocytes Absolute: 0.3 10*3/uL (ref 0.1–1.0)
Monocytes Relative: 8.1 % (ref 3.0–12.0)
Neutro Abs: 2.4 10*3/uL (ref 1.4–7.7)
Neutrophils Relative %: 56.9 % (ref 43.0–77.0)
Platelets: 236 10*3/uL (ref 150.0–400.0)
RBC: 4.11 Mil/uL (ref 3.87–5.11)
RDW: 12.8 % (ref 11.5–15.5)
WBC: 4.3 10*3/uL (ref 4.0–10.5)

## 2023-01-06 LAB — VITAMIN B12: Vitamin B-12: 495 pg/mL (ref 211–911)

## 2023-01-06 LAB — VITAMIN D 25 HYDROXY (VIT D DEFICIENCY, FRACTURES): VITD: 53.71 ng/mL (ref 30.00–100.00)

## 2023-01-06 LAB — HEMOGLOBIN A1C: Hgb A1c MFr Bld: 5.6 % (ref 4.6–6.5)

## 2023-01-06 LAB — TSH: TSH: 0.84 u[IU]/mL (ref 0.35–5.50)

## 2023-01-06 NOTE — Assessment & Plan Note (Signed)
Has any numbness from the big toe up the foot on the medial aspect-bilateral feet Has seen a foot doctor Not sure because Will check B12 level

## 2023-01-06 NOTE — Assessment & Plan Note (Signed)
Chronic Lab Results  Component Value Date   HGBA1C 5.6 10/26/2018   Check a1c Low sugar / carb diet Stressed regular exercise

## 2023-01-06 NOTE — Assessment & Plan Note (Signed)
Chronic GERD controlled Continue omeprazole 40 mg daily 

## 2023-01-06 NOTE — Assessment & Plan Note (Addendum)
Chronic Following with gyn Taking prozac 40 mg daily Still with feeling cold/hot, muscle/joint aches, anxious and is not sleeping well Discussed that she and and should consider hormone therapy-advised that she does some research on her own and discusses with her gynecologist

## 2023-01-06 NOTE — Assessment & Plan Note (Signed)
Chronic Discussed diet Continue regular exercise

## 2023-01-06 NOTE — Assessment & Plan Note (Signed)
Chronic Taking vitamin d daily Check vitamin d level  

## 2023-06-16 LAB — HM MAMMOGRAPHY

## 2023-06-17 ENCOUNTER — Encounter: Payer: Self-pay | Admitting: Internal Medicine

## 2023-10-27 ENCOUNTER — Ambulatory Visit: Payer: Self-pay

## 2023-10-27 NOTE — Telephone Encounter (Signed)
 FYI Only or Action Required?: Action required by provider: Medication request.  Patient was last seen in primary care on 01/06/2023 by Geofm Glade PARAS, MD.  Called Nurse Triage reporting Otalgia.  Symptoms began several days ago.  Interventions attempted: Nothing.  Symptoms are: gradually worsening.  Triage Disposition: See Physician Within 24 Hours  Patient/caregiver understands and will follow disposition?: No, wishes to speak with PCP     Copied from CRM #[id number]. Topic: Clinical - Medication Question >> Oct 27, 2023  3:17 PM Mercedes MATSU wrote: Reason for CRM: Patient called in stating that she has a head cold and a possible ear infection and she wants medication only, I offered her an appointment to be seen and she declined. Patient is requesting a call back and can be reached at [phone].     Reason for Disposition  Earache  (Exceptions: Brief ear pain of lasting less than 60 minutes, or earache occurring during air travel.)  Answer Assessment - Initial Assessment Questions Patient declined an appointment and would like an antibiotic called in for her ear sent to the below pharmacy. Please advise.   [facility] [address] [place], [place], [place] [address] [phone]      1. LOCATION: Which ear is involved?     Right ear  2. ONSET: When did the ear pain start?      5 days ago  3. SEVERITY: How bad is the pain?  (Scale 1-10; mild, moderate or severe)     10/10 4. URI SYMPTOMS: Do you have a runny nose or cough?     Yes 5. FEVER: Do you have a fever? If Yes, ask: What is your temperature, how was it measured, and when did it start?     None recorded  6. CAUSE: Have you been swimming recently?, How often do you use Q-TIPS?, Have you had any recent air travel or scuba diving?     Believes she has an ear infection 7. OTHER SYMPTOMS: Do you have any other symptoms? (e.g., decreased hearing, dizziness, headache, stiff neck, vomiting)      Chills  Protocols used: Earache-A-AH

## 2023-10-28 NOTE — Telephone Encounter (Signed)
 Spoke with patient and appointment scheduled

## 2023-10-29 ENCOUNTER — Ambulatory Visit: Admitting: Family Medicine

## 2024-01-31 ENCOUNTER — Ambulatory Visit (INDEPENDENT_AMBULATORY_CARE_PROVIDER_SITE_OTHER): Admitting: Family Medicine

## 2024-01-31 ENCOUNTER — Ambulatory Visit: Payer: Self-pay

## 2024-01-31 ENCOUNTER — Encounter: Payer: Self-pay | Admitting: Family Medicine

## 2024-01-31 VITALS — BP 122/82 | HR 62 | Temp 98.5°F | Resp 18 | Ht 63.0 in | Wt 161.0 lb

## 2024-01-31 DIAGNOSIS — J0140 Acute pansinusitis, unspecified: Secondary | ICD-10-CM | POA: Diagnosis not present

## 2024-01-31 DIAGNOSIS — J3089 Other allergic rhinitis: Secondary | ICD-10-CM

## 2024-01-31 MED ORDER — LEVOCETIRIZINE DIHYDROCHLORIDE 5 MG PO TABS
5.0000 mg | ORAL_TABLET | Freq: Every evening | ORAL | 1 refills | Status: AC
Start: 2024-01-31 — End: ?

## 2024-01-31 MED ORDER — AMOXICILLIN-POT CLAVULANATE 875-125 MG PO TABS
1.0000 | ORAL_TABLET | Freq: Two times a day (BID) | ORAL | 0 refills | Status: AC
Start: 2024-01-31 — End: 2024-02-07

## 2024-01-31 MED ORDER — FLUCONAZOLE 150 MG PO TABS
150.0000 mg | ORAL_TABLET | Freq: Once | ORAL | 0 refills | Status: AC
Start: 2024-01-31 — End: 2024-01-31

## 2024-01-31 NOTE — Progress Notes (Signed)
 Assessment & Plan Acute non-recurrent pansinusitis - Education provided on sinus infections - Prescribed Diflucan for potential yeast infection, to be taken if symptoms develop. Orders:   amoxicillin -clavulanate (AUGMENTIN ) 875-125 MG tablet; Take 1 tablet by mouth 2 (two) times daily for 7 days.   fluconazole (DIFLUCAN) 150 MG tablet; Take 1 tablet (150 mg total) by mouth once for 1 dose. May repeat after 3 days if needed.  Perennial and seasonal allergic rhinitis  Orders:   levocetirizine (XYZAL ) 5 MG tablet; Take 1 tablet (5 mg total) by mouth every evening.   Follow up plan: Return if symptoms worsen or fail to improve.  Niki Rung, MSN, APRN, FNP-C  Subjective:  HPI: Kathleen Ortiz is a 53 y.o. female presenting on 01/31/2024 for Dizziness (Started on SAT - Head pressure, dizzy - worse when closing eyes, causing some nausea/Some nasal drainage a few days ago )  Discussed the use of AI scribe software for clinical note transcription with the patient, who gave verbal consent to proceed.  She has been experiencing dizziness and nausea since Saturday, with symptoms remaining consistent since onset. Additional symptoms include head pressure and drainage. The dizziness worsens with eye closure or sudden movements.  She has not been taking Xyzal  as she ran out and requires a refill. She has not been using any nasal sprays as she feels her nasal passages are open.  She mentions a history of similar episodes, but notes that this episode has lasted longer than previous ones. She reports difficulty hearing in one ear, describing it as 'faint' and feels that her gland is swollen on that side.  She experienced sneezing and a runny nose four days ago, which resolved after taking over-the-counter medications like DayQuil. However, the dizziness and other symptoms persisted.  She is currently on a liquid diet in an effort to lose weight, as she finds that food makes her feel drowsy and  sluggish.  No current sneezing or runny nose. Dizziness worsens with eye closure or sudden movements.       ROS: Negative unless specifically indicated above in HPI.   Relevant past medical history reviewed and updated as indicated.   Allergies and medications reviewed and updated.   Current Outpatient Medications:    albuterol  (PROAIR  HFA) 108 (90 Base) MCG/ACT inhaler, Inhale 2 puffs into the lungs every 4 (four) hours as needed for wheezing or shortness of breath., Disp: 18 g, Rfl: 0   amoxicillin -clavulanate (AUGMENTIN ) 875-125 MG tablet, Take 1 tablet by mouth 2 (two) times daily for 7 days., Disp: 14 tablet, Rfl: 0   Cholecalciferol 50 MCG (2000 UT) TABS, 1 capsule., Disp: , Rfl:    fluconazole (DIFLUCAN) 150 MG tablet, Take 1 tablet (150 mg total) by mouth once for 1 dose. May repeat after 3 days if needed., Disp: 2 tablet, Rfl: 0   fluticasone  (FLONASE ) 50 MCG/ACT nasal spray, Place 2 sprays into both nostrils daily as needed for allergies or rhinitis., Disp: 16 g, Rfl: 2   omeprazole (PRILOSEC) 40 MG capsule, Take 40 mg by mouth daily., Disp: , Rfl:    Probiotic Product (DIGESTIVE ADVANTAGE) CAPS, 1 capsule, Disp: , Rfl:    triamcinolone  (NASACORT ) 55 MCG/ACT AERO nasal inhaler, Place 2 sprays into the nose daily., Disp: 1 each, Rfl: 12   levocetirizine (XYZAL ) 5 MG tablet, Take 1 tablet (5 mg total) by mouth every evening., Disp: 90 tablet, Rfl: 1  Allergies  Allergen Reactions   Doxycycline Other (See Comments)  headache   Shellfish Allergy    Latex Rash    Objective:   BP 122/82   Pulse 62   Temp 98.5 F (36.9 C)   Resp 18   Ht 5' 3 (1.6 m)   Wt 161 lb (73 kg)   LMP 01/10/2024 (Approximate)   SpO2 98%   BMI 28.52 kg/m    Physical Exam Vitals reviewed.  Constitutional:      General: She is not in acute distress.    Appearance: Normal appearance. She is not ill-appearing, toxic-appearing or diaphoretic.  HENT:     Head: Normocephalic and atraumatic.      Right Ear: Tympanic membrane, ear canal and external ear normal. There is no impacted cerumen.     Left Ear: Tympanic membrane, ear canal and external ear normal. There is no impacted cerumen.     Nose: No congestion or rhinorrhea.     Right Turbinates: Enlarged and pale.     Left Turbinates: Enlarged and pale.     Right Sinus: Maxillary sinus tenderness and frontal sinus tenderness present.     Left Sinus: Maxillary sinus tenderness and frontal sinus tenderness present.     Mouth/Throat:     Mouth: Mucous membranes are moist.     Pharynx: Oropharynx is clear. No oropharyngeal exudate or posterior oropharyngeal erythema.  Eyes:     General: No scleral icterus.       Right eye: No discharge.        Left eye: No discharge.     Conjunctiva/sclera: Conjunctivae normal.  Cardiovascular:     Rate and Rhythm: Normal rate and regular rhythm.     Heart sounds: Normal heart sounds. No murmur heard.    No friction rub. No gallop.  Pulmonary:     Effort: Pulmonary effort is normal. No respiratory distress.     Breath sounds: Normal breath sounds. No stridor. No wheezing, rhonchi or rales.  Musculoskeletal:        General: Normal range of motion.     Cervical back: Normal range of motion.  Lymphadenopathy:     Cervical: No cervical adenopathy.  Skin:    General: Skin is warm and dry.     Capillary Refill: Capillary refill takes less than 2 seconds.  Neurological:     General: No focal deficit present.     Mental Status: She is alert and oriented to person, place, and time. Mental status is at baseline.  Psychiatric:        Mood and Affect: Mood normal.        Behavior: Behavior normal.        Thought Content: Thought content normal.        Judgment: Judgment normal.

## 2024-01-31 NOTE — Assessment & Plan Note (Signed)
 Orders:    levocetirizine (XYZAL) 5 MG tablet; Take 1 tablet (5 mg total) by mouth every evening.

## 2024-01-31 NOTE — Telephone Encounter (Signed)
 FYI Only or Action Required?: FYI only for provider.  Patient was last seen in primary care on 01/06/2023 by Geofm Glade PARAS, MD.  Called Nurse Triage reporting Dizziness.  Symptoms began Saturday.  Interventions attempted: Nothing.  Symptoms are: gradually worsening.  Triage Disposition: See HCP Within 4 Hours (Or PCP Triage)  Patient/caregiver understands and will follow disposition?: Yes     Copied from CRM [id number]. Topic: Clinical - Red Word Triage >> Jan 31, 2024  8:15 AM Kathleen Ortiz wrote: [facility] that prompted transfer to Nurse Triage: Dizziness, when patient closes eyes, it is worsens. Also feeling nauseated, going on since this past Saturday. Reason for Disposition  [1] Dizziness caused by heat exposure, sudden standing, or poor fluid intake AND [2] no improvement after 2 hours of rest and fluids  Answer Assessment - Initial Assessment Questions 1. DESCRIPTION: Describe your dizziness.     Dizziness, 2. LIGHTHEADED: Do you feel lightheaded? (Ortiz.g., somewhat faint, woozy, weak upon standing)     na 3. VERTIGO: Do you feel like either you or the room is spinning or tilting? (i.Ortiz., vertigo)     na 4. SEVERITY: How bad is it?  Do you feel like you are going to faint? Can you stand and walk?     moderate 5. ONSET:  When did the dizziness begin?     Saturday 6. AGGRAVATING FACTORS: Does anything make it worse? (Ortiz.g., standing, change in head position)     Change in position helped 7. HEART RATE: Can you tell me your heart rate? How many beats in 15 seconds?  (Note: Not all patients can do this.)       N/a 8. CAUSE: What do you think is causing the dizziness? (Ortiz.g., decreased fluids or food, diarrhea, emotional distress, heat exposure, new medicine, sudden standing, vomiting; unknown)     Sweating, increase heart rate,SOB, panicky 9. RECURRENT SYMPTOM: Have you had dizziness before? If Yes, ask: When was the last time? What happened that time?      no 10. OTHER SYMPTOMS: Do you have any other symptoms? (Ortiz.g., fever, chest pain, vomiting, diarrhea, bleeding)       nausea 11. PREGNANCY: Is there any chance you are pregnant? When was your last menstrual period?       Na  Bilateral ear issues: can barely hear in right ear and feels like block agenda left ear is decreased.  When close eyes dizziness increases.  Protocols used: Dizziness - Lightheadedness-A-AH
